# Patient Record
Sex: Female | Born: 1995 | Race: Black or African American | Hispanic: No | Smoking: Never smoker
Health system: Southern US, Community
[De-identification: ages and names within clinical notes are randomized; demographics above are authoritative.]

---
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
## 2016-01-03 ENCOUNTER — Encounter (HOSPITAL_COMMUNITY): Payer: Self-pay | Admitting: Emergency Medicine

## 2016-01-03 DIAGNOSIS — R112 Nausea with vomiting, unspecified: Secondary | ICD-10-CM | POA: Insufficient documentation

## 2016-01-03 DIAGNOSIS — R197 Diarrhea, unspecified: Secondary | ICD-10-CM | POA: Insufficient documentation

## 2016-01-03 DIAGNOSIS — Z3202 Encounter for pregnancy test, result negative: Secondary | ICD-10-CM | POA: Insufficient documentation

## 2016-01-03 LAB — CBC
HEMATOCRIT: 41.1 % (ref 36.0–46.0)
HEMOGLOBIN: 13.7 g/dL (ref 12.0–15.0)
MCH: 30.3 pg (ref 26.0–34.0)
MCHC: 33.3 g/dL (ref 30.0–36.0)
MCV: 90.9 fL (ref 78.0–100.0)
Platelets: 200 10*3/uL (ref 150–400)
RBC: 4.52 MIL/uL (ref 3.87–5.11)
RDW: 12 % (ref 11.5–15.5)
WBC: 13.2 10*3/uL — ABNORMAL HIGH (ref 4.0–10.5)

## 2016-01-03 LAB — COMPREHENSIVE METABOLIC PANEL
ALBUMIN: 4.4 g/dL (ref 3.5–5.0)
ALT: 12 U/L — ABNORMAL LOW (ref 14–54)
ANION GAP: 9 (ref 5–15)
AST: 19 U/L (ref 15–41)
Alkaline Phosphatase: 48 U/L (ref 38–126)
BUN: 7 mg/dL (ref 6–20)
CO2: 23 mmol/L (ref 22–32)
Calcium: 9.3 mg/dL (ref 8.9–10.3)
Chloride: 108 mmol/L (ref 101–111)
Creatinine, Ser: 0.73 mg/dL (ref 0.44–1.00)
GFR calc non Af Amer: >60 mL/min (ref >60)
GLUCOSE: 147 mg/dL — AB (ref 65–99)
POTASSIUM: 3.9 mmol/L (ref 3.5–5.1)
SODIUM: 140 mmol/L (ref 135–145)
Total Bilirubin: 0.7 mg/dL (ref 0.3–1.2)
Total Protein: 6.9 g/dL (ref 6.5–8.1)

## 2016-01-03 LAB — LIPASE, BLOOD: LIPASE: 19 U/L (ref 11–51)

## 2016-01-03 NOTE — ED Notes (Signed)
Pt. reports emesis , diarrhea and epigastric pain when vomittting  onset this evening , denies fever or chills.

## 2016-01-04 ENCOUNTER — Emergency Department (HOSPITAL_COMMUNITY)
Admission: EM | Admit: 2016-01-04 | Discharge: 2016-01-04 | Disposition: A | Discharge status: Home / Self Care | Payer: Self-pay | Attending: Emergency Medicine | Admitting: Emergency Medicine

## 2016-01-04 DIAGNOSIS — R112 Nausea with vomiting, unspecified: Secondary | ICD-10-CM

## 2016-01-04 DIAGNOSIS — R197 Diarrhea, unspecified: Secondary | ICD-10-CM

## 2016-01-04 LAB — URINE MICROSCOPIC-ADD ON: BACTERIA UA: NONE SEEN

## 2016-01-04 LAB — URINALYSIS, ROUTINE W REFLEX MICROSCOPIC
BILIRUBIN URINE: NEGATIVE
Glucose, UA: NEGATIVE mg/dL
KETONES UR: 15 mg/dL — AB
Leukocytes, UA: NEGATIVE
NITRITE: NEGATIVE
PH: 6.5 (ref 5.0–8.0)
Protein, ur: NEGATIVE mg/dL
Specific Gravity, Urine: 1.022 (ref 1.005–1.030)

## 2016-01-04 LAB — I-STAT BETA HCG BLOOD, ED (MC, WL, AP ONLY)

## 2016-01-04 MED ORDER — ONDANSETRON HCL 4 MG/2ML IJ SOLN
4.0000 mg | Freq: Once | INTRAMUSCULAR | Status: AC
  Administered 2016-01-04: 4 mg via INTRAVENOUS
  Filled 2016-01-04: qty 2

## 2016-01-04 MED ORDER — OXYCODONE-ACETAMINOPHEN 5-325 MG PO TABS
ORAL_TABLET | ORAL | Status: AC
  Administered 2016-01-04: 01:00:00
  Filled 2016-01-04: qty 1

## 2016-01-04 MED ORDER — ONDANSETRON HCL 4 MG PO TABS: 4.0000 mg | ORAL_TABLET | Freq: Three times a day (TID) | ORAL | Status: DC | PRN

## 2016-01-04 MED ORDER — SODIUM CHLORIDE 0.9 % IV BOLUS (SEPSIS)
1000.0000 mL | Freq: Once | INTRAVENOUS | Status: AC
  Administered 2016-01-04: 1000 mL via INTRAVENOUS

## 2016-01-04 NOTE — ED Notes (Signed)
Dr. Rees at bedside  

## 2016-01-04 NOTE — Discharge Instructions (Signed)

## 2016-01-04 NOTE — ED Provider Notes (Signed)
CSN: [id number]     Arrival date & time 01/03/16  2221 History  By signing my name below, I, [name], attest that this documentation has been prepared under the direction and in the presence of Tilden FossaElizabeth Shaunae Sieloff, MD Electronically Signed: Charlean Merlohini Rajnarayanan, ED Scribe 01/04/2016 at 7:51 AM.   Chief Complaint  Patient presents with  . Emesis  . Diarrhea  . Abdominal Pain   The history is provided by the patient. No language interpreter was used.    HPI Comments: Gretta BeganDaijah J Burnett is a 20 y.o. female who presents to the Emergency Department complaining of diarrhea and emesis which began when she woke up from her nap this evening. Pt denies any abdominal pain, fever, chills, or dysuria. 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 Symptoms are moderate and constant nature.  History reviewed. No pertinent past medical history. History reviewed. No pertinent past surgical history. No family history on file. Social History  Substance Use Topics  . Smoking status: Never Smoker   . Smokeless tobacco: None  . Alcohol Use: Yes   OB History    No data available     Review of Systems  Constitutional: Negative for fever and chills.  Gastrointestinal: Positive for vomiting and diarrhea. Negative for abdominal pain.  Genitourinary: Negative for dysuria.  All other systems reviewed and are negative.  Allergies  Review of patient's allergies indicates no known allergies.  Home Medications   Prior to Admission medications   Medication Sig Start Date End Date Taking? Authorizing Provider  ibuprofen (ADVIL,MOTRIN) 200 MG tablet Take 200 mg by mouth every 6 (six) hours as needed for moderate pain.   Yes Historical Provider, MD  ondansetron (ZOFRAN) 4 MG tablet Take 1 tablet (4 mg total) by mouth every 8 (eight) hours as needed for nausea or vomiting. 01/04/16   Tilden FossaElizabeth Elisabet Gutzmer, MD   BP 136/82 mmHg  Pulse 97  Temp(Src)  97.8 F (36.6 C) (Oral)  Resp 18  Ht 5\' 6"  (1.676 m)  Wt 142 lb (64.411 kg)  BMI 22.93 kg/m2  SpO2 98%  LMP 12/12/2015 Physical Exam  Constitutional: She is oriented to person, place, and time. She appears well-developed and well-nourished.  HENT:  Head: Normocephalic and atraumatic.  Cardiovascular: Normal rate and regular rhythm.   No murmur heard. Pulmonary/Chest: Effort normal and breath sounds normal. No respiratory distress.  Abdominal: Soft. There is no tenderness. There is no rebound and no guarding.  Musculoskeletal: She exhibits no edema or tenderness.  Neurological: She is alert and oriented to person, place, and time.  Skin: Skin is warm and dry.  Psychiatric: She has a normal mood and affect. Her behavior is normal.  Nursing note and vitals reviewed.   ED Course  Procedures  DIAGNOSTIC STUDIES: Oxygen Saturation is 98% on RA, normal by my interpretation.    COORDINATION OF CARE: 1:47 AM-Discussed treatment plan which includes UA, urine pregnancy test, ed medications, and blood work, with pt at bedside and pt agreed to plan.   Labs Review Labs Reviewed  COMPREHENSIVE METABOLIC PANEL - Abnormal; Notable for the following:    Glucose, Bld 147 (*)    ALT 12 (*)    All other components within normal limits  CBC - Abnormal; Notable for the following:    WBC 13.2 (*)    All other components within normal limits  URINALYSIS, ROUTINE W REFLEX MICROSCOPIC (NOT AT [facility]) - Abnormal; Notable  for the following:    Hgb urine dipstick TRACE (*)    Ketones, ur 15 (*)    All other components within normal limits  URINE MICROSCOPIC-ADD ON - Abnormal; Notable for the following:    Squamous Epithelial / LPF 0-5 (*)    All other components within normal limits  LIPASE, BLOOD  POC URINE PREG, ED  I-STAT BETA HCG BLOOD, ED ([facility], [facility], [facility] ONLY)    Imaging Review No results found. I have personally reviewed and evaluated these images and lab results as part of my medical  decision-making.   EKG Interpretation None      MDM   Final diagnoses:  Nausea vomiting and diarrhea   Patient here for evaluation of vomiting and diarrhea. Abdominal examination is benign on initial examination and repeat evaluation. She is tolerating oral fluids without difficulty. Discussed with patient homecare for vomiting and diarrhea, likely gastroenteritis. Discussed close return precautions if she develops any abdominal pain or new/worrisome symptoms.  I personally performed the services described in this documentation, which was scribed in my presence. The recorded information has been reviewed and is accurate.    Tilden Fossa, MD 01/04/16 [phone]

## 2016-11-26 ENCOUNTER — Encounter (HOSPITAL_COMMUNITY): Payer: Self-pay | Admitting: *Deleted

## 2016-11-26 ENCOUNTER — Emergency Department (HOSPITAL_COMMUNITY)
Admission: EM | Admit: 2016-11-26 | Discharge: 2016-11-26 | Disposition: A | Discharge status: Home / Self Care | Payer: 59 | Attending: Emergency Medicine | Admitting: Emergency Medicine

## 2016-11-26 DIAGNOSIS — R05 Cough: Secondary | ICD-10-CM | POA: Diagnosis present

## 2016-11-26 DIAGNOSIS — J111 Influenza due to unidentified influenza virus with other respiratory manifestations: Secondary | ICD-10-CM | POA: Diagnosis not present

## 2016-11-26 MED ORDER — OSELTAMIVIR PHOSPHATE 75 MG PO CAPS: 75.0000 mg | ORAL_CAPSULE | Freq: Two times a day (BID) | ORAL | 0 refills | Status: DC

## 2016-11-26 MED ORDER — HYDROCOD POLST-CPM POLST ER 10-8 MG/5ML PO SUER: 5.0000 mL | Freq: Every evening | ORAL | 0 refills | Status: DC | PRN

## 2016-11-26 MED ORDER — BENZONATATE 100 MG PO CAPS: 100.0000 mg | ORAL_CAPSULE | Freq: Three times a day (TID) | ORAL | 0 refills | Status: DC

## 2016-11-26 NOTE — ED Provider Notes (Signed)
[facility]-EMERGENCY DEPT Provider Note   CSN: [id number] Arrival date & time: 11/26/16  1511  By signing my name below, I, Talbert NanPaul Grant, attest that this documentation has been prepared under the direction and in the presence of Kerrie BuffaloHope Adoria Kawamoto, NP. Electronically Signed: Talbert NanPaul Grant, Scribe. 11/26/16. 5:40 PM  .   History   Chief Complaint Chief Complaint  Patient presents with  . Influenza    HPI Phyllis Burnett is a 21 y.o. female who presents to the Emergency Department complaining of gradual onset, moderate, persistent generalized body aches that started yesterday. Pt has associated headache, fatigue, dry cough, sore throat, subjective fever, chills, sweats, congestion, diarrhea. Pt denies nausea, vomiting.    The history is provided by the patient. No language interpreter was used.    History reviewed. No pertinent past medical history.  There are no active problems to display for this patient.   History reviewed. No pertinent surgical history.  OB History    No data available       Home Medications    Prior to Admission medications   Medication Sig Start Date End Date Taking? Authorizing Provider  benzonatate (TESSALON) 100 MG capsule Take 1 capsule (100 mg total) by mouth every 8 (eight) hours. 11/26/16   Marton Malizia Orlene OchM Dandra Shambaugh, NP  chlorpheniramine-HYDROcodone (TUSSIONEX PENNKINETIC ER) 10-8 MG/5ML SUER Take 5 mLs by mouth at bedtime as needed for cough. 11/26/16   Benjiman Sedgwick Orlene OchM Kimaya Whitlatch, NP  ibuprofen (ADVIL,MOTRIN) 200 MG tablet Take 200 mg by mouth every 6 (six) hours as needed for moderate pain.    Historical Provider, MD  ondansetron (ZOFRAN) 4 MG tablet Take 1 tablet (4 mg total) by mouth every 8 (eight) hours as needed for nausea or vomiting. 01/04/16   Tilden FossaElizabeth Rees, MD  oseltamivir (TAMIFLU) 75 MG capsule Take 1 capsule (75 mg total) by mouth every 12 (twelve) hours. 11/26/16   Isair Inabinet Orlene OchM Aseel Uhde, NP    Family History History reviewed. No pertinent family history.  Social  History Social History  Substance Use Topics  . Smoking status: Never Smoker  . Smokeless tobacco: Not on file  . Alcohol use Yes     Allergies   Patient has no known allergies.   Review of Systems Review of Systems  Constitutional: Positive for fatigue.  HENT: Positive for congestion, sinus pain and sore throat.   Respiratory: Chest tightness: with cough.   Gastrointestinal: Positive for diarrhea. Negative for nausea and vomiting.  Genitourinary: Negative for decreased urine volume.  Musculoskeletal: Positive for arthralgias and myalgias.  Skin: Negative for rash.  Neurological: Positive for headaches.  Psychiatric/Behavioral: Negative for confusion.     Physical Exam Updated Vital Signs BP 100/66 (BP Location: Right Arm)   Pulse 78   Temp 99.6 F (37.6 C) (Oral)   Resp 18   LMP 11/26/2016   SpO2 100%   Physical Exam  Constitutional: She is oriented to person, place, and time. She appears well-developed and well-nourished. No distress.  HENT:  Head: Normocephalic and atraumatic.  Right Ear: Tympanic membrane normal.  Left Ear: Tympanic membrane normal.  Mouth/Throat: Uvula is midline, oropharynx is clear and moist and mucous membranes are normal.  Eyes: EOM are normal. Pupils are equal, round, and reactive to light.  Neck: Neck supple.  Cardiovascular: Normal rate and regular rhythm.   Pulmonary/Chest: Effort normal. She has no wheezes. She has no rales.  Abdominal: Soft. Bowel sounds are normal. There is no tenderness.  Musculoskeletal: Normal range of motion.  Neurological: She  is alert and oriented to person, place, and time.  Skin: Skin is warm and dry.  Psychiatric: She has a normal mood and affect. Her behavior is normal.  Nursing note and vitals reviewed.    ED Treatments / Results   DIAGNOSTIC STUDIES: Oxygen Saturation is 100% on room air, normal by my interpretation.    COORDINATION OF CARE: 5:40 PM Discussed treatment plan with pt at bedside  and pt agreed to plan.  Labs (all labs ordered are listed, but only abnormal results are displayed) Labs Reviewed - No data to display  Radiology No results found.  Procedures Procedures (including critical care time)  Medications Ordered in ED   Initial Impression / Assessment and Plan / ED Course  I have reviewed the triage vital signs and the nursing notes. Patient with symptoms consistent with influenza.  Vitals are stable, low-grade fever.  No signs of dehydration, tolerating PO's.  Lungs are clear. Due to patient's presentation and physical exam a chest x-ray was not ordered bc likely diagnosis of flu. Patient will be discharged with instructions to orally hydrate, rest, and use over-the-counter medications such as anti-inflammatories ibuprofen and Aleve for muscle aches and Tylenol for fever.  Patient will also be given a cough suppressant and Tamiflu. Return precautions.   Final Clinical Impressions(s) / ED Diagnoses   Final diagnoses:  Influenza    New Prescriptions Discharge Medication List as of 11/26/2016  5:43 PM    START taking these medications   Details  benzonatate (TESSALON) 100 MG capsule Take 1 capsule (100 mg total) by mouth every 8 (eight) hours., Starting Tue 11/26/2016, Print    chlorpheniramine-HYDROcodone (TUSSIONEX PENNKINETIC ER) 10-8 MG/5ML SUER Take 5 mLs by mouth at bedtime as needed for cough., Starting Tue 11/26/2016, Print    oseltamivir (TAMIFLU) 75 MG capsule Take 1 capsule (75 mg total) by mouth every 12 (twelve) hours., Starting Tue 11/26/2016, Print       I personally performed the services described in this documentation, which was scribed in my presence. The recorded information has been reviewed and is accurate.     [address] [place], [place] 11/28/16 [id number]    Alvira Monday, MD 11/28/16 [phone]

## 2016-11-26 NOTE — ED Triage Notes (Signed)
Pt reports having the flu. States she has bodyaches, headache, sore throat, fever since yesterday. Also having n/v/d. Mask on pt at triage.

## 2016-12-02 ENCOUNTER — Emergency Department (HOSPITAL_COMMUNITY)
Admission: EM | Admit: 2016-12-02 | Discharge: 2016-12-03 | Disposition: A | Discharge status: Home / Self Care | Payer: 59 | Attending: Emergency Medicine | Admitting: Emergency Medicine

## 2016-12-02 ENCOUNTER — Encounter (HOSPITAL_COMMUNITY): Payer: Self-pay | Admitting: *Deleted

## 2016-12-02 DIAGNOSIS — N939 Abnormal uterine and vaginal bleeding, unspecified: Secondary | ICD-10-CM | POA: Diagnosis present

## 2016-12-02 DIAGNOSIS — Z7982 Long term (current) use of aspirin: Secondary | ICD-10-CM | POA: Diagnosis not present

## 2016-12-02 DIAGNOSIS — N946 Dysmenorrhea, unspecified: Secondary | ICD-10-CM | POA: Insufficient documentation

## 2016-12-02 LAB — POC URINE PREG, ED: PREG TEST UR: NEGATIVE

## 2016-12-02 NOTE — ED Triage Notes (Signed)
Pt started her period a week ago, reports increased vaginal bleeding since being diagnosed with the flu. Has had mild abdominal cramping.

## 2016-12-02 NOTE — ED Provider Notes (Signed)
[facility] Provider Note   CSN: [id number] Arrival date & time: 12/02/16  2150  By signing my name below, I, Modena JanskyAlbert Thayil, attest that this documentation has been prepared under the direction and in the presence of non-physician practitioner, Kerrie BuffaloHope Neese, NP. Electronically Signed: Modena JanskyAlbert Thayil, Scribe. 12/02/2016. 11:43 PM.  History   Chief Complaint Chief Complaint  Patient presents with  . Vaginal Bleeding   The history is provided by the patient.  Vaginal Bleeding  Primary symptoms include vaginal bleeding. There has been no fever. This is a new problem. The current episode started 6 to 12 hours ago. The problem occurs constantly. The problem has not changed since onset.Associated symptoms include abdominal pain. She has tried acetaminophen for the symptoms. The treatment provided no relief. She uses nothing for contraception.   HPI Comments: Phyllis Burnett is a 21 y.o. female who presents to the Emergency Department complaining of vaginal bleeding that started today. She was seen in the ED for influenza on 11/26/16. She states she is currently on her menstrual cycle, which initially came on lightly then became heavy. She has hx of severe cycles that are usually treated with ibuprofen. No modifying factors for current episode. She reports associated abdominal cramping. She denies any contraceptive use or other complaints. She does report having been started on OC's about 4 years ago to try and help with her periods but she only took for a few months. She has not returned to her GYN since then.   History reviewed. No pertinent past medical history.  There are no active problems to display for this patient.   History reviewed. No pertinent surgical history.  OB History    No data available       Home Medications    Prior to Admission medications   Medication Sig Start Date End Date Taking? Authorizing Provider  aspirin-acetaminophen-caffeine (EXCEDRIN MIGRAINE)  [phone] MG tablet Take 1 tablet by mouth every 6 (six) hours as needed for headache.   Yes Historical Provider, MD  benzonatate (TESSALON) 100 MG capsule Take 1 capsule (100 mg total) by mouth every 8 (eight) hours. Patient taking differently: Take 100 mg by mouth every 8 (eight) hours as needed for cough.  11/26/16  Yes Hope Orlene OchM Neese, NP  chlorpheniramine-HYDROcodone (TUSSIONEX PENNKINETIC ER) 10-8 MG/5ML SUER Take 5 mLs by mouth at bedtime as needed for cough. 11/26/16  Yes Hope Orlene OchM Neese, NP  oseltamivir (TAMIFLU) 75 MG capsule Take 1 capsule (75 mg total) by mouth every 12 (twelve) hours. 11/26/16  Yes Hope Orlene OchM Neese, NP  naproxen (NAPROSYN) 375 MG tablet Take 1 tablet (375 mg total) by mouth 2 (two) times daily. 12/03/16   Hope Orlene OchM Neese, NP    Family History No family history on file.  Social History Social History  Substance Use Topics  . Smoking status: Never Smoker  . Smokeless tobacco: Never Used  . Alcohol use Yes     Allergies   Patient has no known allergies.   Review of Systems Review of Systems  Gastrointestinal: Positive for abdominal pain.  Genitourinary: Positive for vaginal bleeding.  All other systems reviewed and are negative.    Physical Exam Updated Vital Signs BP 125/83 (BP Location: Right Arm)   Pulse 68   Temp 98 F (36.7 C)   Resp 21   Ht 5\' 6"  (1.676 m)   Wt 140 lb (63.5 kg)   LMP 11/25/2016   SpO2 100%   BMI 22.60 kg/m   Physical Exam  Constitutional: She appears well-developed and well-nourished. No distress.  HENT:  Head: Normocephalic.  Eyes: Conjunctivae are normal.  Neck: Neck supple.  Cardiovascular: Normal rate and regular rhythm.   Pulmonary/Chest: Effort normal.  Abdominal: Soft. There is no tenderness.  Genitourinary:  Genitourinary Comments: Pelvic Exam: external genitalia without lesions, small blood vaginal vault, no cervical motion tenderness, uterus without enlargement.   Musculoskeletal: Normal range of motion.    Neurological: She is alert.  Skin: Skin is warm and dry.  Psychiatric: She has a normal mood and affect. Her behavior is normal.  Nursing note and vitals reviewed.    ED Treatments / Results  DIAGNOSTIC STUDIES: Oxygen Saturation is 100% on RA, normal by my interpretation.    COORDINATION OF CARE: 11:47 PM- Pt advised of plan for treatment and pt agrees.  Labs (all labs ordered are listed, but only abnormal results are displayed) Labs Reviewed  WET PREP, GENITAL - Abnormal; Notable for the following:       Result Value   Clue Cells Wet Prep HPF POC PRESENT (*)    WBC, Wet Prep HPF POC MANY (*)    All other components within normal limits  URINALYSIS, ROUTINE W REFLEX MICROSCOPIC - Abnormal; Notable for the following:    Hgb urine dipstick MODERATE (*)    Squamous Epithelial / LPF 0-5 (*)    All other components within normal limits  CBC WITH DIFFERENTIAL/PLATELET  BASIC METABOLIC PANEL  POC URINE PREG, ED  I-STAT BETA HCG BLOOD, ED ([facility], [facility], [facility] ONLY)  GC/CHLAMYDIA PROBE AMP ([place]) NOT AT [facility]   Radiology No results found.  Procedures Procedures (including critical care time)  Medications Ordered in ED Medications - No data to display   Initial Impression / Assessment and Plan / ED Course  I have reviewed the triage vital signs and the nursing notes.  Pertinent labs results that were available during my care of the patient were reviewed by me and considered in my medical decision making (see chart for details).   Final Clinical Impressions(s) / ED Diagnoses  21 y.o. female with vaginal bleeding and cramping stable for d/c without anemia and with only minimal bleeding at this time. She does not appear ill. Discussed dysmenorrhea and OC's to try and decreased the bleeding and cramping. Patient states that she will make an appointment with her GYN to discuss the options.   Final diagnoses:  Dysmenorrhea    New Prescriptions Discharge Medication List as of  12/03/2016  1:03 AM    START taking these medications   Details  naproxen (NAPROSYN) 375 MG tablet Take 1 tablet (375 mg total) by mouth 2 (two) times daily., Starting Tue 12/03/2016, Print      I personally performed the services described in this documentation, which was scribed in my presence. The recorded information has been reviewed and is accurate.     [address] [place], [place] 12/04/16 1610    Glynn Octave, MD 12/04/16 [phone]

## 2016-12-03 LAB — BASIC METABOLIC PANEL
Anion gap: 7 (ref 5–15)
BUN: 8 mg/dL (ref 6–20)
CHLORIDE: 105 mmol/L (ref 101–111)
CO2: 26 mmol/L (ref 22–32)
Calcium: 9.5 mg/dL (ref 8.9–10.3)
Creatinine, Ser: 0.7 mg/dL (ref 0.44–1.00)
GFR calc Af Amer: >60 mL/min (ref >60)
GFR calc non Af Amer: >60 mL/min (ref >60)
Glucose, Bld: 84 mg/dL (ref 65–99)
POTASSIUM: 3.8 mmol/L (ref 3.5–5.1)
SODIUM: 138 mmol/L (ref 135–145)

## 2016-12-03 LAB — CBC WITH DIFFERENTIAL/PLATELET
Basophils Absolute: 0 10*3/uL (ref 0.0–0.1)
Basophils Relative: 0 %
EOS ABS: 0.1 10*3/uL (ref 0.0–0.7)
EOS PCT: 1 %
HCT: 39.7 % (ref 36.0–46.0)
HEMOGLOBIN: 13.3 g/dL (ref 12.0–15.0)
LYMPHS ABS: 3 10*3/uL (ref 0.7–4.0)
LYMPHS PCT: 31 %
MCH: 30.2 pg (ref 26.0–34.0)
MCHC: 33.5 g/dL (ref 30.0–36.0)
MCV: 90.2 fL (ref 78.0–100.0)
Monocytes Absolute: 0.5 10*3/uL (ref 0.1–1.0)
Monocytes Relative: 5 %
Neutro Abs: 6.2 10*3/uL (ref 1.7–7.7)
Neutrophils Relative %: 63 %
PLATELETS: 233 10*3/uL (ref 150–400)
RBC: 4.4 MIL/uL (ref 3.87–5.11)
RDW: 12 % (ref 11.5–15.5)
WBC: 9.8 10*3/uL (ref 4.0–10.5)

## 2016-12-03 LAB — URINALYSIS, ROUTINE W REFLEX MICROSCOPIC
BACTERIA UA: NONE SEEN
Bilirubin Urine: NEGATIVE
Glucose, UA: NEGATIVE mg/dL
Ketones, ur: NEGATIVE mg/dL
Leukocytes, UA: NEGATIVE
Nitrite: NEGATIVE
PH: 6 (ref 5.0–8.0)
Protein, ur: NEGATIVE mg/dL
SPECIFIC GRAVITY, URINE: 1.019 (ref 1.005–1.030)

## 2016-12-03 LAB — WET PREP, GENITAL
SPERM: NONE SEEN
TRICH WET PREP: NONE SEEN
Yeast Wet Prep HPF POC: NONE SEEN

## 2016-12-03 LAB — I-STAT BETA HCG BLOOD, ED (MC, WL, AP ONLY)

## 2016-12-03 LAB — GC/CHLAMYDIA PROBE AMP (~~LOC~~) NOT AT ARMC
Chlamydia: NEGATIVE
Neisseria Gonorrhea: NEGATIVE

## 2016-12-03 MED ORDER — NAPROXEN 375 MG PO TABS: 375.0000 mg | ORAL_TABLET | Freq: Two times a day (BID) | ORAL | 0 refills | Status: DC

## 2016-12-03 NOTE — ED Notes (Signed)
Pt departed in NAD.  

## 2016-12-03 NOTE — Discharge Instructions (Signed)
Follow up with your GYN to discuss your heavy and painful periods.

## 2017-10-08 DIAGNOSIS — Z803 Family history of malignant neoplasm of breast: Secondary | ICD-10-CM | POA: Insufficient documentation

## 2017-12-08 ENCOUNTER — Emergency Department (HOSPITAL_COMMUNITY)
Admission: EM | Admit: 2017-12-08 | Discharge: 2017-12-08 | Disposition: A | Discharge status: Home / Self Care | Payer: Managed Care, Other (non HMO) | Attending: Emergency Medicine | Admitting: Emergency Medicine

## 2017-12-08 ENCOUNTER — Other Ambulatory Visit: Payer: Self-pay

## 2017-12-08 ENCOUNTER — Encounter (HOSPITAL_COMMUNITY): Payer: Self-pay | Admitting: *Deleted

## 2017-12-08 DIAGNOSIS — R109 Unspecified abdominal pain: Secondary | ICD-10-CM | POA: Diagnosis not present

## 2017-12-08 DIAGNOSIS — R112 Nausea with vomiting, unspecified: Secondary | ICD-10-CM

## 2017-12-08 DIAGNOSIS — Z79899 Other long term (current) drug therapy: Secondary | ICD-10-CM | POA: Diagnosis not present

## 2017-12-08 LAB — CBC
HEMATOCRIT: 43.3 % (ref 36.0–46.0)
Hemoglobin: 14.4 g/dL (ref 12.0–15.0)
MCH: 30.6 pg (ref 26.0–34.0)
MCHC: 33.3 g/dL (ref 30.0–36.0)
MCV: 92.1 fL (ref 78.0–100.0)
Platelets: 198 10*3/uL (ref 150–400)
RBC: 4.7 MIL/uL (ref 3.87–5.11)
RDW: 13 % (ref 11.5–15.5)
WBC: 9.5 10*3/uL (ref 4.0–10.5)

## 2017-12-08 LAB — COMPREHENSIVE METABOLIC PANEL
ALT: 13 U/L — ABNORMAL LOW (ref 14–54)
AST: 19 U/L (ref 15–41)
Albumin: 4.2 g/dL (ref 3.5–5.0)
Alkaline Phosphatase: 50 U/L (ref 38–126)
Anion gap: 9 (ref 5–15)
BUN: 10 mg/dL (ref 6–20)
CHLORIDE: 105 mmol/L (ref 101–111)
CO2: 23 mmol/L (ref 22–32)
Calcium: 8.9 mg/dL (ref 8.9–10.3)
Creatinine, Ser: 0.81 mg/dL (ref 0.44–1.00)
GFR calc Af Amer: >60 mL/min (ref >60)
Glucose, Bld: 135 mg/dL — ABNORMAL HIGH (ref 65–99)
POTASSIUM: 4.2 mmol/L (ref 3.5–5.1)
SODIUM: 137 mmol/L (ref 135–145)
Total Bilirubin: 0.9 mg/dL (ref 0.3–1.2)
Total Protein: 6.9 g/dL (ref 6.5–8.1)

## 2017-12-08 LAB — URINALYSIS, ROUTINE W REFLEX MICROSCOPIC
Bilirubin Urine: NEGATIVE
GLUCOSE, UA: NEGATIVE mg/dL
Hgb urine dipstick: NEGATIVE
Ketones, ur: 5 mg/dL — AB
LEUKOCYTES UA: NEGATIVE
Nitrite: NEGATIVE
PH: 5 (ref 5.0–8.0)
Protein, ur: NEGATIVE mg/dL
Specific Gravity, Urine: 1.028 (ref 1.005–1.030)

## 2017-12-08 LAB — I-STAT BETA HCG BLOOD, ED (MC, WL, AP ONLY): I-stat hCG, quantitative: <5 m[IU]/mL (ref <5)

## 2017-12-08 LAB — LIPASE, BLOOD: LIPASE: 21 U/L (ref 11–51)

## 2017-12-08 MED ORDER — ONDANSETRON 4 MG PO TBDP
4.0000 mg | ORAL_TABLET | Freq: Once | ORAL | Status: AC
  Administered 2017-12-08: 4 mg via ORAL
  Filled 2017-12-08: qty 1

## 2017-12-08 MED ORDER — ONDANSETRON 4 MG PO TBDP: 4.0000 mg | ORAL_TABLET | Freq: Three times a day (TID) | ORAL | 0 refills | Status: DC | PRN

## 2017-12-08 NOTE — ED Notes (Signed)
Pt given ginger ale for PO challenge. Will reassess. 

## 2017-12-08 NOTE — ED Notes (Signed)
ED Provider at bedside. 

## 2017-12-08 NOTE — Discharge Instructions (Signed)
Push fluids in small amounts and advance diet as tolerated. Take Zofran for nausea as needed.

## 2017-12-08 NOTE — ED Provider Notes (Signed)
[facility] EMERGENCY DEPARTMENT Provider Note   CSN: [id number] Arrival date & time: 12/08/17  0137     History   Chief Complaint Chief Complaint  Patient presents with  . Emesis    HPI Phyllis Burnett is a 22 y.o. female.  Patient presents to the emergency department for evaluation of vomiting that started around 8:00 pm last night. She has had approximately 12 episodes of nonbloody emesis. She reports her last bowel movement with loose. No fever. She has upper abdominal discomfort that started after multiple episodes of vomiting. No cough, SOB, body aches or congestion.    The history is provided by the patient. No language interpreter was used.  Emesis   Associated symptoms include abdominal pain (See HPI.). Pertinent negatives include no chills, no cough, no fever and no myalgias.    History reviewed. No pertinent past medical history.  There are no active problems to display for this patient.   History reviewed. No pertinent surgical history.  OB History    No data available       Home Medications    Prior to Admission medications   Medication Sig Start Date End Date Taking? Authorizing Provider  aspirin-acetaminophen-caffeine (EXCEDRIN MIGRAINE) [phone] MG tablet Take 1 tablet by mouth every 6 (six) hours as needed for headache.    [provider]  benzonatate (TESSALON) 100 MG capsule Take 1 capsule (100 mg total) by mouth every 8 (eight) hours. Patient taking differently: Take 100 mg by mouth every 8 (eight) hours as needed for cough.  11/26/16   Janne Napoleon, NP  chlorpheniramine-HYDROcodone [facility]) 10-8 MG/5ML SUER Take 5 mLs by mouth at bedtime as needed for cough. 11/26/16   Janne Napoleon, NP  naproxen (NAPROSYN) 375 MG tablet Take 1 tablet (375 mg total) by mouth 2 (two) times daily. 12/03/16   Janne Napoleon, NP  oseltamivir (TAMIFLU) 75 MG capsule Take 1 capsule (75 mg total) by mouth every 12 (twelve)  hours. 11/26/16   Janne Napoleon, NP    Family History No family history on file.  Social History Social History   Tobacco Use  . Smoking status: Never Smoker  . Smokeless tobacco: Never Used  Substance Use Topics  . Alcohol use: Yes  . Drug use: No     Allergies   Patient has no known allergies.   Review of Systems Review of Systems  Constitutional: Negative for chills and fever.  HENT: Negative.  Negative for congestion.   Respiratory: Negative.  Negative for cough and shortness of breath.   Cardiovascular: Negative.   Gastrointestinal: Positive for abdominal pain (See HPI.), nausea and vomiting.  Genitourinary: Negative.  Negative for decreased urine volume, dysuria, vaginal bleeding and vaginal discharge.  Musculoskeletal: Negative.  Negative for myalgias.  Skin: Negative.   Neurological: Negative.  Negative for weakness and light-headedness.     Physical Exam Updated Vital Signs BP 118/65   Pulse 74   Temp 98.6 F (37 C) (Oral)   Resp (!) 21   Ht 5\' 7"  (1.702 m)   Wt 72.6 kg (160 lb)   LMP 10/16/2017   SpO2 100%   BMI 25.06 kg/m   Physical Exam  Constitutional: She is oriented to person, place, and time. She appears well-developed and well-nourished.  HENT:  Head: Normocephalic.  Mouth/Throat: Oropharynx is clear and moist.  Neck: Normal range of motion. Neck supple.  Cardiovascular: Normal rate and regular rhythm.  No murmur heard. Pulmonary/Chest:  Effort normal and breath sounds normal. She has no wheezes. She has no rales.  Abdominal: Soft. Bowel sounds are normal. There is no tenderness. There is no rebound and no guarding.  Musculoskeletal: Normal range of motion.  Neurological: She is alert and oriented to person, place, and time.  Skin: Skin is warm and dry. No rash noted.  Psychiatric: She has a normal mood and affect.     ED Treatments / Results  Labs (all labs ordered are listed, but only abnormal results are displayed) Labs Reviewed    COMPREHENSIVE METABOLIC PANEL - Abnormal; Notable for the following components:      Result Value   Glucose, Bld 135 (*)    ALT 13 (*)    All other components within normal limits  URINALYSIS, ROUTINE W REFLEX MICROSCOPIC - Abnormal; Notable for the following components:   Ketones, ur 5 (*)    All other components within normal limits  LIPASE, BLOOD  CBC  I-STAT BETA HCG BLOOD, ED ([facility], [facility], [facility] ONLY)  POC URINE PREG, ED   Results for orders placed or performed during the hospital encounter of 12/08/17  Lipase, blood  Result Value Ref Range   Lipase 21 11 - 51 U/L  Comprehensive metabolic panel  Result Value Ref Range   Sodium 137 135 - 145 mmol/L   Potassium 4.2 3.5 - 5.1 mmol/L   Chloride 105 101 - 111 mmol/L   CO2 23 22 - 32 mmol/L   Glucose, Bld 135 (H) 65 - 99 mg/dL   BUN 10 6 - 20 mg/dL   Creatinine, Ser 1.61 0.44 - 1.00 mg/dL   Calcium 8.9 8.9 - 09.6 mg/dL   Total Protein 6.9 6.5 - 8.1 g/dL   Albumin 4.2 3.5 - 5.0 g/dL   AST 19 15 - 41 U/L   ALT 13 (L) 14 - 54 U/L   Alkaline Phosphatase 50 38 - 126 U/L   Total Bilirubin 0.9 0.3 - 1.2 mg/dL   GFR calc non Af Amer >60 >60 mL/min   GFR calc Af Amer >60 >60 mL/min   Anion gap 9 5 - 15  CBC  Result Value Ref Range   WBC 9.5 4.0 - 10.5 K/uL   RBC 4.70 3.87 - 5.11 MIL/uL   Hemoglobin 14.4 12.0 - 15.0 g/dL   HCT 04.5 40.9 - 81.1 %   MCV 92.1 78.0 - 100.0 fL   MCH 30.6 26.0 - 34.0 pg   MCHC 33.3 30.0 - 36.0 g/dL   RDW 91.4 78.2 - 95.6 %   Platelets 198 150 - 400 K/uL  Urinalysis, Routine w reflex microscopic  Result Value Ref Range   Color, Urine YELLOW YELLOW   APPearance CLEAR CLEAR   Specific Gravity, Urine 1.028 1.005 - 1.030   pH 5.0 5.0 - 8.0   Glucose, UA NEGATIVE NEGATIVE mg/dL   Hgb urine dipstick NEGATIVE NEGATIVE   Bilirubin Urine NEGATIVE NEGATIVE   Ketones, ur 5 (A) NEGATIVE mg/dL   Protein, ur NEGATIVE NEGATIVE mg/dL   Nitrite NEGATIVE NEGATIVE   Leukocytes, UA NEGATIVE NEGATIVE  I-Stat beta hCG  blood, ED  Result Value Ref Range   I-stat hCG, quantitative <5.0 <5 mIU/mL   Comment 3            EKG  EKG Interpretation None       Radiology No results found.  Procedures Procedures (including critical care time)  Medications Ordered in ED Medications  ondansetron (ZOFRAN-ODT) disintegrating tablet 4 mg (4  mg Oral Given 12/08/17 0306)     Initial Impression / Assessment and Plan / ED Course  I have reviewed the triage vital signs and the nursing notes.  Pertinent labs & imaging results that were available during my care of the patient were reviewed by me and considered in my medical decision making (see chart for details).     Patient here for nausea and vomiting that started last night. No fever, urinary symptoms, URI symptoms. She is not pregnant.   No vomiting while in the room. Zofran provided and patient subsequently passed a PO challenge and feels better. She can be discharged home with Rx Zofran and return precautions.   Final Clinical Impressions(s) / ED Diagnoses   Final diagnoses:  None   1. Emesis  ED Discharge Orders    None       Elpidio AnisUpstill, Shawnna Pancake, PA-C 12/08/17 0550    Glynn Octaveancour, Stephen, MD 12/08/17 [phone]

## 2017-12-08 NOTE — ED Triage Notes (Signed)
The pt is c/o vomiting since 2000 tonight with generalized abd pain  lmp jan 10th

## 2019-04-05 ENCOUNTER — Ambulatory Visit: Payer: Self-pay

## 2019-04-05 NOTE — Telephone Encounter (Signed)
Patient called and says she was around someone who tested positive for covid, but had completed their 14 day quarantine. She says she started having symptoms about 3-4 days ago, diarrhea, fatigue, headache, she says mild symptoms. She says she doesn't have a cough, no difficulty breathing. She says she checks her temperature and no fever, but at times feels warm. She asked about testing. I advised she will need to establish with a PCP, then have a virtual visit and they will refer her for testing, then a nurse will call to schedule. I advised if she didn't want to go that route, she could contact the [facility], go to a [facility] or do an e-visit on the [facility] site, she verbalized understanding and says she may just go to the UC or call back tomorrow to schedule with a provider.  Reason for Disposition . [1] COVID-19 infection suspected by caller or triager AND [2] mild symptoms (cough, fever, or others) AND [1] no complications or SOB  Answer Assessment - Initial Assessment Questions 1. COVID-19 DIAGNOSIS: "Who made your Coronavirus (COVID-19) diagnosis?" "Was it confirmed by a positive lab test?" If not diagnosed by a HCP, ask "Are there lots of cases (community spread) where you live?" (See public health department website, if unsure)     No diagnosis 2. ONSET: "When did the COVID-19 symptoms start?"      3 days ago 3. WORST SYMPTOM: "What is your worst symptom?" (e.g., cough, fever, shortness of breath, muscle aches)     Fatigue is the worse; have a headache 4. COUGH: "Do you have a cough?" If so, ask: "How bad is the cough?"       No 5. FEVER: "Do you have a fever?" If so, ask: "What is your temperature, how was it measured, and when did it start?"     No 6. RESPIRATORY STATUS: "Describe your breathing?" (e.g., shortness of breath, wheezing, unable to speak)      No problems 7. BETTER-SAME-WORSE: "Are you getting better, staying the same or getting worse compared to yesterday?"   If getting worse, ask, "In what way?"     Worse with feeling tired 8. HIGH RISK DISEASE: "Do you have any chronic medical problems?" (e.g., asthma, heart or lung disease, weak immune system, etc.)     N/A 9. PREGNANCY: "Is there any chance you are pregnant?" "When was your last menstrual period?"    N/A 10. OTHER SYMPTOMS: "Do you have any other symptoms?"  (e.g., chills, fatigue, headache, loss of smell or taste, muscle pain, sore throat)      Diarrhea, headache, fatigue  Protocols used: CORONAVIRUS (COVID-19) DIAGNOSED OR SUSPECTED-A-AH

## 2019-04-06 ENCOUNTER — Other Ambulatory Visit: Payer: Self-pay

## 2019-04-06 ENCOUNTER — Other Ambulatory Visit: Payer: Self-pay | Admitting: General Practice

## 2019-04-06 ENCOUNTER — Ambulatory Visit (HOSPITAL_COMMUNITY)
Admission: EM | Admit: 2019-04-06 | Discharge: 2019-04-06 | Disposition: A | Discharge status: Home / Self Care | Payer: Managed Care, Other (non HMO)

## 2019-04-06 ENCOUNTER — Encounter (HOSPITAL_COMMUNITY): Payer: Self-pay | Admitting: Urgent Care

## 2019-04-06 ENCOUNTER — Telehealth: Payer: Self-pay | Admitting: *Deleted

## 2019-04-06 DIAGNOSIS — R5383 Other fatigue: Secondary | ICD-10-CM | POA: Diagnosis not present

## 2019-04-06 DIAGNOSIS — R195 Other fecal abnormalities: Secondary | ICD-10-CM

## 2019-04-06 DIAGNOSIS — R5381 Other malaise: Secondary | ICD-10-CM

## 2019-04-06 DIAGNOSIS — B349 Viral infection, unspecified: Secondary | ICD-10-CM

## 2019-04-06 DIAGNOSIS — R6889 Other general symptoms and signs: Secondary | ICD-10-CM

## 2019-04-06 DIAGNOSIS — G44209 Tension-type headache, unspecified, not intractable: Secondary | ICD-10-CM

## 2019-04-06 NOTE — ED Triage Notes (Signed)
Patient reports symptoms started on Saturday of feeling tired, mild headache, general, mild weakness, loose stool.    Reports contact with covid positive patient, post - quarantine

## 2019-04-06 NOTE — ED Provider Notes (Addendum)
MRN: [id number] DOB: 11-Jan-1996  Subjective:   Phyllis Burnett is a 23 y.o. female presenting for 3-day history of progressive mild malaise and fatigue.  Patient is also had mild intermittent dull headaches with 1-2 loose stools per day.  She reports that she is concerned about having coronavirus as she works with [employer], works with people on probation.  She has not tried any medications for relief.  Of note, she reports that she did come into contact with a person that tested positive for COVID but was status post a 2-week quarantine.  Patient does not have any chronic conditions or surgeries.  She is in general good health.  She has been trying to hydrate a lot better and is trying to practice a healthy diet.  No current facility-administered medications for this encounter.   Current Outpatient Medications:  Marland Kitchen  VITAMIN D PO, Take by mouth., Disp: , Rfl:  .  aspirin-acetaminophen-caffeine (EXCEDRIN MIGRAINE) 250-250-65 MG tablet, Take 1 tablet by mouth every 6 (six) hours as needed for headache., Disp: , Rfl:    No Known Allergies  History reviewed. No pertinent past medical history.   History reviewed. No pertinent surgical history.  Review of Systems  Constitutional: Positive for malaise/fatigue. Negative for fever.  HENT: Negative for congestion, ear pain, sinus pain and sore throat.   Eyes: Negative for blurred vision, double vision, discharge and redness.  Respiratory: Negative for cough, hemoptysis, shortness of breath and wheezing.   Cardiovascular: Negative for chest pain.  Gastrointestinal: Positive for diarrhea (loose stools). Negative for abdominal pain, nausea and vomiting.  Genitourinary: Negative for dysuria, flank pain and hematuria.  Musculoskeletal: Negative for myalgias.  Skin: Negative for rash.  Neurological: Positive for headaches. Negative for weakness.  Psychiatric/Behavioral: Negative for depression and substance abuse.    Objective:   Vitals: BP  132/74 (BP Location: Left Arm)   Pulse 77   Temp 98.2 F (36.8 C) (Temporal)   Resp 16   LMP 03/08/2019   SpO2 100%   Physical Exam Constitutional:      General: She is not in acute distress.    Appearance: Normal appearance. She is well-developed. She is not ill-appearing, toxic-appearing or diaphoretic.  HENT:     Head: Normocephalic and atraumatic.     Nose: Nose normal.     Mouth/Throat:     Mouth: Mucous membranes are moist.  Eyes:     Extraocular Movements: Extraocular movements intact.     Pupils: Pupils are equal, round, and reactive to light.  Cardiovascular:     Rate and Rhythm: Normal rate and regular rhythm.     Pulses: Normal pulses.     Heart sounds: Normal heart sounds. No murmur. No friction rub. No gallop.   Pulmonary:     Effort: Pulmonary effort is normal. No respiratory distress.     Breath sounds: Normal breath sounds. No stridor. No wheezing, rhonchi or rales.  Skin:    General: Skin is warm and dry.     Findings: No rash.  Neurological:     Mental Status: She is alert and oriented to person, place, and time.  Psychiatric:        Mood and Affect: Mood normal.        Behavior: Behavior normal.        Thought Content: Thought content normal.     Assessment and Plan :   1. Malaise and fatigue   2. Acute non intractable tension-type headache   3. Loose stools  4. Viral syndrome    Likely viral in etiology d/t physical exam findings. Counseled patient on nature of COVID-19 including modes of transmission, diagnostic testing, management and supportive care.  Advised supportive care, offered symptomatic relief. Counseled patient on potential for adverse effects with medications prescribed/recommended today, ER and return-to-clinic precautions discussed, patient verbalized understanding.     Wallis BambergMani, Montez Stryker, PA-C 04/06/19 1158    Wallis BambergMani, Conlan Miceli, [place] JerseyPA-C 04/06/19 1159

## 2019-04-06 NOTE — Telephone Encounter (Signed)
-----   Message from Jaynee Eagles, [place] sent at 04/06/2019 11:50 AM EDT ----- Regarding: COVID testing

## 2019-04-06 NOTE — Telephone Encounter (Signed)
Called patient to schedule covid testing but she reports that she has already been tested @ the [facility] testing tent today.

## 2019-04-06 NOTE — Discharge Instructions (Addendum)
We will manage this as a viral syndrome.  Please take Tylenol 500mg  every 6 hours. Hydrate very well with at least 2 liters of water. Eat light meals such as soups to replenish electrolytes and soft fruits, veggies. Start an antihistamine like Zyrtec, Allegra or Claritin for any congestion or allergies.

## 2019-04-07 ENCOUNTER — Other Ambulatory Visit: Payer: Managed Care, Other (non HMO)

## 2019-04-07 DIAGNOSIS — R6889 Other general symptoms and signs: Secondary | ICD-10-CM

## 2019-04-12 LAB — NOVEL CORONAVIRUS, NAA: SARS-CoV-2, NAA: NOT DETECTED

## 2019-06-23 ENCOUNTER — Ambulatory Visit (HOSPITAL_COMMUNITY)
Admission: EM | Admit: 2019-06-23 | Discharge: 2019-06-23 | Disposition: A | Discharge status: Home / Self Care | Payer: Managed Care, Other (non HMO) | Attending: Family Medicine | Admitting: Family Medicine

## 2019-06-23 ENCOUNTER — Other Ambulatory Visit: Payer: Self-pay

## 2019-06-23 ENCOUNTER — Encounter (HOSPITAL_COMMUNITY): Payer: Self-pay | Admitting: Family Medicine

## 2019-06-23 DIAGNOSIS — Z20828 Contact with and (suspected) exposure to other viral communicable diseases: Secondary | ICD-10-CM | POA: Insufficient documentation

## 2019-06-23 DIAGNOSIS — J029 Acute pharyngitis, unspecified: Secondary | ICD-10-CM

## 2019-06-23 LAB — POCT RAPID STREP A: Streptococcus, Group A Screen (Direct): NEGATIVE

## 2019-06-23 MED ORDER — CHLORHEXIDINE GLUCONATE 0.12 % MT SOLN: 15.0000 mL | Freq: Two times a day (BID) | OROMUCOSAL | 0 refills | Status: DC

## 2019-06-23 NOTE — Discharge Instructions (Addendum)
The throat test here is negative.  We will run a longer, more accurate test.  In the meantime, start gargles with antiseptic called in, twice daily.

## 2019-06-23 NOTE — ED Provider Notes (Signed)
[place]    CSN: [id number] Arrival date & time: 06/23/19  0865      History   Chief Complaint Chief Complaint  Patient presents with  . Appointment  . (8:30) Sore Throat    HPI Phyllis Burnett is a 23 y.o. female.   23 yo woman, established [facility] patient, presents today for evaluation of dry, uncomfortable throat x 2 days.  Patient has h/o allergies.  Patient is a Engineer, manufacturing systems.  Covid-19 screening is negative for any other symptoms.  No fever, cough, diarrhea.     History reviewed. No pertinent past medical history.  There are no active problems to display for this patient.   History reviewed. No pertinent surgical history.  OB History   No obstetric history on file.      Home Medications    Prior to Admission medications   Medication Sig Start Date End Date Taking? Authorizing Provider  aspirin-acetaminophen-caffeine (EXCEDRIN MIGRAINE) [phone] MG tablet Take 1 tablet by mouth every 6 (six) hours as needed for headache.    [provider]  chlorhexidine (PERIDEX) 0.12 % solution Use as directed 15 mLs in the mouth or throat 2 (two) times daily. 06/23/19   Robyn Haber, MD  VITAMIN D PO Take by mouth.    [provider]    Family History History reviewed. No pertinent family history.  Social History Social History   Tobacco Use  . Smoking status: Never Smoker  . Smokeless tobacco: Never Used  Substance Use Topics  . Alcohol use: Yes  . Drug use: No     Allergies   Patient has no known allergies.   Review of Systems Review of Systems  HENT: Positive for sore throat.   All other systems reviewed and are negative.    Physical Exam Triage Vital Signs ED Triage Vitals  Enc Vitals Group     BP      Pulse      Resp      Temp      Temp src      SpO2      Weight      Height      Head Circumference      Peak Flow      Pain Score      Pain Loc      Pain Edu?      Excl. in [place]?    No data  found.  Updated Vital Signs BP (!) 131/98 (BP Location: Left Arm)   Pulse 91   Temp 98.8 F (37.1 C) (Oral)   Resp 18   LMP 06/03/2019   SpO2 100%    Physical Exam Vitals signs and nursing note reviewed.  Constitutional:      General: She is not in acute distress.    Appearance: Normal appearance. She is normal weight. She is not ill-appearing.  HENT:     Head: Normocephalic.     Nose: Nose normal.     Mouth/Throat:     Mouth: Mucous membranes are moist.     Pharynx: Oropharynx is clear.  Eyes:     Conjunctiva/sclera: Conjunctivae normal.  Neck:     Musculoskeletal: Normal range of motion and neck supple.  Cardiovascular:     Rate and Rhythm: Normal rate.  Pulmonary:     Effort: Pulmonary effort is normal.  Musculoskeletal: Normal range of motion.  Skin:    General: Skin is warm and dry.  Neurological:     General: No  focal deficit present.     Mental Status: She is alert and oriented to person, place, and time.  Psychiatric:        Mood and Affect: Mood normal.        Behavior: Behavior normal.        Thought Content: Thought content normal.        Judgment: Judgment normal.      UC Treatments / Results  Labs (all labs ordered are listed, but only abnormal results are displayed) Labs Reviewed  NOVEL CORONAVIRUS, NAA (HOSP ORDER, SEND-OUT TO REF LAB; TAT 18-24 HRS)    EKG   Radiology No results found.  Procedures Procedures (including critical care time)  Medications Ordered in UC Medications - No data to display  Initial Impression / Assessment and Plan / UC Course  I have reviewed the triage vital signs and the nursing notes.  Pertinent labs & imaging results that were available during my care of the patient were reviewed by me and considered in my medical decision making (see chart for details).    Final Clinical Impressions(s) / UC Diagnoses   Final diagnoses:  Acute pharyngitis, unspecified etiology     Discharge Instructions      The throat test here is negative.  We will run a longer, more accurate test.  In the meantime, start gargles with antiseptic called in, twice daily.    ED Prescriptions    Medication Sig Dispense Auth. Provider   chlorhexidine (PERIDEX) 0.12 % solution Use as directed 15 mLs in the mouth or throat 2 (two) times daily. 120 mL Elvina SidleLauenstein, Elber Galyean, MD     Controlled Substance Prescriptions [place] Controlled Substance Registry consulted? Not Applicable   Elvina SidleLauenstein, Hollis Tuller, MD 06/23/19 [phone]

## 2019-06-23 NOTE — ED Triage Notes (Signed)
Pt presents with dry and sore throat X 2 days.

## 2019-06-24 LAB — NOVEL CORONAVIRUS, NAA (HOSP ORDER, SEND-OUT TO REF LAB; TAT 18-24 HRS): SARS-CoV-2, NAA: NOT DETECTED

## 2019-06-25 ENCOUNTER — Encounter (HOSPITAL_COMMUNITY): Payer: Self-pay

## 2019-06-25 LAB — CULTURE, GROUP A STREP (THRC)

## 2020-01-19 ENCOUNTER — Encounter (HOSPITAL_COMMUNITY): Payer: Self-pay | Admitting: Emergency Medicine

## 2020-01-19 ENCOUNTER — Emergency Department (HOSPITAL_COMMUNITY)
Admission: EM | Admit: 2020-01-19 | Discharge: 2020-01-19 | Disposition: A | Discharge status: Home / Self Care | Payer: Managed Care, Other (non HMO) | Attending: Emergency Medicine | Admitting: Emergency Medicine

## 2020-01-19 ENCOUNTER — Other Ambulatory Visit: Payer: Self-pay

## 2020-01-19 DIAGNOSIS — M545 Low back pain, unspecified: Secondary | ICD-10-CM

## 2020-01-19 DIAGNOSIS — M25552 Pain in left hip: Secondary | ICD-10-CM | POA: Diagnosis not present

## 2020-01-19 DIAGNOSIS — M25532 Pain in left wrist: Secondary | ICD-10-CM

## 2020-01-19 MED ORDER — IBUPROFEN 800 MG PO TABS
800.0000 mg | ORAL_TABLET | Freq: Once | ORAL | Status: AC
  Administered 2020-01-19: 17:00:00 800 mg via ORAL
  Filled 2020-01-19: qty 1

## 2020-01-19 MED ORDER — METHOCARBAMOL 500 MG PO TABS: 500.0000 mg | ORAL_TABLET | Freq: Every evening | ORAL | 0 refills | Status: DC | PRN
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
## 2020-01-19 NOTE — ED Notes (Signed)
Pt's name was called x3 both in the waiting room and outside but no one responsed.

## 2020-01-19 NOTE — Discharge Instructions (Signed)
Take ibuprofen 3 times a day with meals.  Do not take other anti-inflammatories at the same time (Advil, Motrin, naproxen, Aleve). You may supplement with Tylenol if you need further pain control. Use robaxin as needed for muscle stiffness or soreness.  Have caution, this may make you tired or groggy.  Do not drive or operate heavy machinery while taking this medicine. Use ice packs or heating pads if this helps control your pain. You will likely have continued muscle stiffness and soreness over the next couple days.  Follow-up in 1 week if your symptoms are not improving. Return to the emergency room if you develop vision changes, vomiting, slurred speech, numbness, loss of bowel or bladder control, or any new or worsening symptoms.

## 2020-02-06 ENCOUNTER — Other Ambulatory Visit: Payer: Self-pay

## 2020-02-06 ENCOUNTER — Ambulatory Visit (HOSPITAL_COMMUNITY)
Admission: EM | Admit: 2020-02-06 | Discharge: 2020-02-06 | Disposition: A | Discharge status: Home / Self Care | Payer: Managed Care, Other (non HMO)

## 2020-02-06 ENCOUNTER — Emergency Department (HOSPITAL_COMMUNITY)
Admission: EM | Admit: 2020-02-06 | Discharge: 2020-02-06 | Disposition: A | Discharge status: Home / Self Care | Payer: Managed Care, Other (non HMO) | Attending: Emergency Medicine | Admitting: Emergency Medicine

## 2020-02-06 ENCOUNTER — Encounter (HOSPITAL_COMMUNITY): Payer: Self-pay | Admitting: Emergency Medicine

## 2020-02-06 DIAGNOSIS — R519 Headache, unspecified: Secondary | ICD-10-CM | POA: Diagnosis present

## 2020-02-06 DIAGNOSIS — Y999 Unspecified external cause status: Secondary | ICD-10-CM | POA: Diagnosis not present

## 2020-02-06 DIAGNOSIS — S060X0S Concussion without loss of consciousness, sequela: Secondary | ICD-10-CM

## 2020-02-06 DIAGNOSIS — Y939 Activity, unspecified: Secondary | ICD-10-CM | POA: Insufficient documentation

## 2020-02-06 DIAGNOSIS — Y929 Unspecified place or not applicable: Secondary | ICD-10-CM | POA: Insufficient documentation

## 2020-02-06 DIAGNOSIS — S060X0A Concussion without loss of consciousness, initial encounter: Secondary | ICD-10-CM | POA: Diagnosis not present

## 2020-02-06 MED ORDER — ONDANSETRON HCL 4 MG PO TABS: 4.0000 mg | ORAL_TABLET | Freq: Three times a day (TID) | ORAL | 0 refills | Status: DC | PRN
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
## 2020-02-06 NOTE — ED Notes (Signed)
Patient verbalizes understanding of discharge instructions. Opportunity for questioning and answers were provided. Armband removed by staff, pt discharged from ED to home 

## 2020-02-07 ENCOUNTER — Telehealth: Payer: Self-pay | Admitting: Family Medicine

## 2020-02-07 ENCOUNTER — Telehealth: Payer: Self-pay

## 2020-02-07 NOTE — Telephone Encounter (Signed)
Called pt and scheduled her to see Dr. Denyse Amass on Thursday, 02/10/20.  She has been having HA, change in appetite, sensitivity to light and some difficulty staying asleep.
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
## 2020-02-07 NOTE — Telephone Encounter (Signed)
Patient called requesting to schedule a concussion visit. She was seen in the ED and was referred here for follow up.

## 2020-02-10 ENCOUNTER — Other Ambulatory Visit: Payer: Self-pay

## 2020-02-10 ENCOUNTER — Encounter: Payer: Self-pay | Admitting: Family Medicine

## 2020-02-10 ENCOUNTER — Ambulatory Visit (INDEPENDENT_AMBULATORY_CARE_PROVIDER_SITE_OTHER): Payer: Managed Care, Other (non HMO) | Admitting: Family Medicine

## 2020-02-10 VITALS — BP 118/70 | HR 64 | Ht 66.0 in | Wt 184.6 lb

## 2020-02-10 DIAGNOSIS — S060X0A Concussion without loss of consciousness, initial encounter: Secondary | ICD-10-CM

## 2020-02-10 MED ORDER — NORTRIPTYLINE HCL 25 MG PO CAPS: 25.0000 mg | ORAL_CAPSULE | Freq: Every day | ORAL | 2 refills | Status: DC

## 2020-02-10 NOTE — Patient Instructions (Addendum)
Thank you for coming in today. Take nortriptyline at bedtime.  We will figure out job duties.  Let me know if I need the change the note. Let me know what it needs to say.   Let me know if the medicine is not helpful or too annoying.  Dry mouth and fatigue are side effects.
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
## 2020-02-15 ENCOUNTER — Encounter: Payer: Self-pay | Admitting: Family Medicine

## 2020-02-15 NOTE — Telephone Encounter (Signed)
Copy sent to scan and on hold up front for patient.

## 2020-02-24 ENCOUNTER — Ambulatory Visit: Payer: Managed Care, Other (non HMO) | Admitting: Family Medicine

## 2020-02-24 ENCOUNTER — Other Ambulatory Visit: Payer: Self-pay

## 2020-02-24 ENCOUNTER — Encounter: Payer: Self-pay | Admitting: Family Medicine

## 2020-02-24 VITALS — BP 100/66 | Ht 66.0 in | Wt 184.0 lb

## 2020-02-24 DIAGNOSIS — S060X0D Concussion without loss of consciousness, subsequent encounter: Secondary | ICD-10-CM

## 2020-02-24 DIAGNOSIS — G44319 Acute post-traumatic headache, not intractable: Secondary | ICD-10-CM

## 2020-02-24 MED ORDER — TOPIRAMATE 50 MG PO TABS: ORAL_TABLET | ORAL | 1 refills | Status: DC

## 2020-02-24 NOTE — Patient Instructions (Addendum)
Return for recheck prior to June 20th.  Try topamax for headache prevention.  If helping a little in 2 weeks let me know and I can increase the dose.   Topiramate tablets What is this medicine? TOPIRAMATE (toe PYRE a mate) is used to treat seizures in adults or children with epilepsy. It is also used for the prevention of migraine headaches. This medicine may be used for other purposes; ask your health care provider or pharmacist if you have questions. COMMON BRAND NAME(S): Topamax, Topiragen What should I tell my health care provider before I take this medicine? They need to know if you have any of these conditions:  bleeding disorders  kidney disease  lung or breathing disease, like asthma  suicidal thoughts, plans, or attempt; a previous suicide attempt by you or a family member  an unusual or allergic reaction to topiramate, other medicines, foods, dyes, or preservatives  pregnant or trying to get pregnant  breast-feeding How should I use this medicine? Take this medicine by mouth with a glass of water. Follow the directions on the prescription label. Do not cut, crush or chew this medicine. Swallow the tablets whole. You can take it with or without food. If it upsets your stomach, take it with food. Take your medicine at regular intervals. Do not take it more often than directed. Do not stop taking except on your doctor's advice. A special MedGuide will be given to you by the pharmacist with each prescription and refill. Be sure to read this information carefully each time. Talk to your pediatrician regarding the use of this medicine in children. While this drug may be prescribed for children as young as 53 years of age for selected conditions, precautions do apply. Overdosage: If you think you have taken too much of this medicine contact a poison control center or emergency room at once. NOTE: This medicine is only for you. Do not share this medicine with others. What if I miss a  dose? If you miss a dose, take it as soon as you can. If your next dose is to be taken in less than 6 hours, then do not take the missed dose. Take the next dose at your regular time. Do not take double or extra doses. What may interact with this medicine? This medicine may interact with the following medications:  acetazolamide  alcohol  antihistamines for allergy, cough, and cold  aspirin and aspirin-like medicines  atropine  birth control pills  certain medicines for anxiety or sleep  certain medicines for bladder problems like oxybutynin, tolterodine  certain medicines for depression like amitriptyline, fluoxetine, sertraline  certain medicines for seizures like carbamazepine, phenobarbital, phenytoin, primidone, valproic acid, zonisamide  certain medicines for stomach problems like dicyclomine, hyoscyamine  certain medicines for travel sickness like scopolamine  certain medicines for Parkinson's disease like benztropine, trihexyphenidyl  certain medicines that treat or prevent blood clots like warfarin, enoxaparin, dalteparin, apixaban, dabigatran, and rivaroxaban  digoxin  general anesthetics like halothane, isoflurane, methoxyflurane, propofol  hydrochlorothiazide  ipratropium  lithium  medicines that relax muscles for surgery  metformin  narcotic medicines for pain  NSAIDs, medicines for pain and inflammation, like ibuprofen or naproxen  phenothiazines like chlorpromazine, mesoridazine, prochlorperazine, thioridazine  pioglitazone This list may not describe all possible interactions. Give your health care provider a list of all the medicines, herbs, non-prescription drugs, or dietary supplements you use. Also tell them if you smoke, drink alcohol, or use illegal drugs. Some items may interact with your medicine.  What should I watch for while using this medicine? Visit your doctor or health care professional for regular checks on your progress. Tell your  health care professional if your symptoms do not start to get better or if they get worse. Do not stop taking except on your health care professional's advice. You may develop a severe reaction. Your health care professional will tell you how much medicine to take. Wear a medical ID bracelet or chain. Carry a card that describes your disease and details of your medicine and dosage times. This medicine can reduce the response of your body to heat or cold. Dress warm in cold weather and stay hydrated in hot weather. If possible, avoid extreme temperatures like saunas, hot tubs, very hot or cold showers, or activities that can cause dehydration such as vigorous exercise. Check with your health care professional if you have severe diarrhea, nausea, and vomiting, or if you sweat a lot. The loss of too much body fluid may make it dangerous for you to take this medicine. You may get drowsy or dizzy. Do not drive, use machinery, or do anything that needs mental alertness until you know how this medicine affects you. Do not stand up or sit up quickly, especially if you are an older patient. This reduces the risk of dizzy or fainting spells. Alcohol may interfere with the effect of this medicine. Avoid alcoholic drinks. Tell your health care professional right away if you have any change in your eyesight. Patients and their families should watch out for new or worsening depression or thoughts of suicide. Also watch out for sudden changes in feelings such as feeling anxious, agitated, panicky, irritable, hostile, aggressive, impulsive, severely restless, overly excited and hyperactive, or not being able to sleep. If this happens, especially at the beginning of treatment or after a change in dose, call your healthcare professional. This medicine may cause serious skin reactions. They can happen weeks to months after starting the medicine. Contact your health care provider right away if you notice fevers or flu-like  symptoms with a rash. The rash may be red or purple and then turn into blisters or peeling of the skin. Or, you might notice a red rash with swelling of the face, lips or lymph nodes in your neck or under your arms. Birth control may not work properly while you are taking this medicine. Talk to your health care professional about using an extra method of birth control. Women should inform their health care professional if they wish to become pregnant or think they might be pregnant. There is a potential for serious side effects and harm to an unborn child. Talk to your health care professional for more information. What side effects may I notice from receiving this medicine? Side effects that you should report to your doctor or health care professional as soon as possible:  allergic reactions like skin rash, itching or hives, swelling of the face, lips, or tongue  blood in the urine  changes in vision  confusion  loss of memory  pain in lower back or side  pain when urinating  redness, blistering, peeling or loosening of the skin, including inside the mouth  signs and symptoms of bleeding such as bloody or black, tarry stools; red or dark brown urine; spitting up blood or brown material that looks like coffee grounds; red spots on the skin; unusual bruising or bleeding from the eyes, gums, or nose  signs and symptoms of increased acid in the body like  breathing fast; fast heartbeat; headache; confusion; unusually weak or tired; nausea, vomiting  suicidal thoughts, mood changes  trouble speaking or understanding  unusual sweating  unusually weak or tired Side effects that usually do not require medical attention (report to your doctor or health care professional if they continue or are bothersome):  dizziness  drowsiness  fever  loss of appetite  nausea, vomiting  pain, tingling, numbness in the hands or feet  stomach pain  tiredness  upset stomach This list may not  describe all possible side effects. Call your doctor for medical advice about side effects. You may report side effects to FDA at 1-800-FDA-1088. Where should I keep my medicine? Keep out of the reach of children. Store at room temperature between 15 and 30 degrees C (59 and 86 degrees F). Throw away any unused medicine after the expiration date. NOTE: This sheet is a summary. It may not cover all possible information. If you have questions about this medicine, talk to your doctor, pharmacist, or health care provider.  2020 Elsevier/Gold Standard (2019-04-22 15:07:20)
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
## 2020-03-16 ENCOUNTER — Ambulatory Visit (INDEPENDENT_AMBULATORY_CARE_PROVIDER_SITE_OTHER): Payer: Managed Care, Other (non HMO) | Admitting: Family Medicine

## 2020-03-16 ENCOUNTER — Encounter: Payer: Self-pay | Admitting: Family Medicine

## 2020-03-16 ENCOUNTER — Other Ambulatory Visit: Payer: Self-pay

## 2020-03-16 VITALS — BP 124/70 | Ht 66.0 in | Wt 181.0 lb

## 2020-03-16 DIAGNOSIS — S060X0D Concussion without loss of consciousness, subsequent encounter: Secondary | ICD-10-CM
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
## 2020-03-16 NOTE — Patient Instructions (Addendum)
Thank you for coming in today.  I will get those forms done ASAP.  Send them to me.  Cleared to return to training.  Continue topirmate  Your pill bottle on 5/20 has 30 pills and 1 refill.  If this becomes a chronic medicine let me know and I can send in 90 days with refills.

## 2020-03-20 ENCOUNTER — Encounter: Payer: Self-pay | Admitting: Family Medicine

## 2020-03-21 ENCOUNTER — Encounter: Payer: Self-pay | Admitting: Family Medicine

## 2020-03-29 ENCOUNTER — Ambulatory Visit (HOSPITAL_COMMUNITY)
Admission: EM | Admit: 2020-03-29 | Discharge: 2020-03-29 | Disposition: A | Discharge status: Home / Self Care | Payer: Managed Care, Other (non HMO) | Attending: Family Medicine | Admitting: Family Medicine

## 2020-03-29 ENCOUNTER — Encounter (HOSPITAL_COMMUNITY): Payer: Self-pay

## 2020-03-29 ENCOUNTER — Other Ambulatory Visit: Payer: Self-pay

## 2020-03-29 DIAGNOSIS — Z113 Encounter for screening for infections with a predominantly sexual mode of transmission: Secondary | ICD-10-CM | POA: Insufficient documentation

## 2020-03-29 NOTE — Discharge Instructions (Signed)
   Your swab tests are pending.  If your test results are positive, we will call you.  You may need additional treatment and your partner(s) may also need treatment.      

## 2020-03-29 NOTE — ED Triage Notes (Signed)
Patient here requesting STD testing. States her partner is experiencing a burning sensation with urination. Patient denies any discharge, odor, or pain.

## 2020-03-29 NOTE — ED Provider Notes (Signed)
[facility] [place]   [id number] 03/29/20 Arrival Time: 6160   CC: VAGINAL DISCHARGE  SUBJECTIVE:  Phyllis Burnett is a 24 y.o. female who presents for STD screening. Declines HIV/RPR screen today. Reports that she is asymptomatic. Reports that her partner is experiencing dysuria. Reports that her partner has not been medically evaluated. She denies fever, chills, nausea, vomiting, abdominal or pelvic pain, urinary symptoms, vaginal itching, vaginal odor, vaginal bleeding, dyspareunia, vaginal rashes or lesions.   Patient's last menstrual period was 03/26/2020 (exact date). Current birth control method: Compliant with BC:  ROS: As per HPI.  All other pertinent ROS negative.     History reviewed. No pertinent past medical history. History reviewed. No pertinent surgical history. No Known Allergies No current facility-administered medications on file prior to encounter.   Current Outpatient Medications on File Prior to Encounter  Medication Sig Dispense Refill   aspirin-acetaminophen-caffeine (EXCEDRIN MIGRAINE) 250-250-65 MG tablet Take 1 tablet by mouth every 6 (six) hours as needed for headache.     ibuprofen (ADVIL) 200 MG tablet Take 200 mg by mouth every 6 (six) hours as needed.     topiramate (TOPAMAX) 50 MG tablet One half tab by mouth daily for a week, then one tab by mouth daily. 30 tablet 1   VITAMIN D PO Take by mouth.      Social History   Socioeconomic History   Marital status: Soil scientist    Spouse name: Not on file   Number of children: Not on file   Years of education: Not on file   Highest education level: Not on file  Occupational History   Not on file  Tobacco Use   Smoking status: Never Smoker   Smokeless tobacco: Never Used  Substance and Sexual Activity   Alcohol use: Yes   Drug use: No   Sexual activity: Not on file  Other Topics Concern   Not on file  Social History Narrative   Not on file   Social Determinants of  Health   Financial Resource Strain:    Difficulty of Paying Living Expenses:   Food Insecurity:    Worried About Charity fundraiser in the Last Year:    Arboriculturist in the Last Year:   Transportation Needs:    Film/video editor (Medical):    Lack of Transportation (Non-Medical):   Physical Activity:    Days of Exercise per Week:    Minutes of Exercise per Session:   Stress:    Feeling of Stress :   Social Connections:    Frequency of Communication with Friends and Family:    Frequency of Social Gatherings with Friends and Family:    Attends Religious Services:    Active Member of Clubs or Organizations:    Attends Music therapist:    Marital Status:   Intimate Partner Violence:    Fear of Current or Ex-Partner:    Emotionally Abused:    Physically Abused:    Sexually Abused:    History reviewed. No pertinent family history.  OBJECTIVE:  Vitals:   03/29/20 1230  BP: 119/73  Pulse: 63  Resp: 12  Temp: 97.8 F (36.6 C)  SpO2: 100%     General appearance: Alert, NAD, appears stated age Head: NCAT Throat: lips, mucosa, and tongue normal; teeth and gums normal Lungs: CTA bilaterally without adventitious breath sounds Heart: regular rate and rhythm.  Radial pulses 2+ symmetrical bilaterally Back: no CVA tenderness Abdomen: soft, non-tender; bowel  sounds normal; no masses or organomegaly; no guarding or rebound tenderness GU: declines, vaginal self swab obtained Skin: warm and dry Psychological:  Alert and cooperative. Normal mood and affect.  LABS:  Results for orders placed or performed during the hospital encounter of 06/23/19  Novel Coronavirus, NAA (Hosp order, Send-out to Ref Lab; TAT 18-24 hrs   Specimen: Nasopharyngeal Swab; Respiratory  Result Value Ref Range   SARS-CoV-2, NAA NOT DETECTED NOT DETECTED   Coronavirus Source NASOPHARYNGEAL   Culture, group A strep   Specimen: Throat  Result Value Ref Range    Specimen Description THROAT    Special Requests NONE    Culture      NO GROUP A STREP (S.PYOGENES) ISOLATED Performed at [facility], [address], [place], [place] [address]    Report Status 06/25/2019 FINAL   POCT rapid strep A [facility])  Result Value Ref Range   Streptococcus, Group A Screen (Direct) NEGATIVE NEGATIVE    Labs Reviewed  CERVICOVAGINAL ANCILLARY ONLY    ASSESSMENT & PLAN:  1. Screen for STD (sexually transmitted disease)     No orders of the defined types were placed in this encounter.   Pending: Labs Reviewed  CERVICOVAGINAL ANCILLARY ONLY   STD Screen  Vaginal self-swab obtained.  We will follow up with you regarding abnormal results Declines HIV/ syphilis testing today  If tests results are positive, please abstain from sexual activity until you and your partner(s) have been treated Follow up with PCP or [facility] if symptoms persists Return here or go to ER if you have any new or worsening symptoms fever, chills, nausea, vomiting, abdominal or pelvic pain, painful intercourse, vaginal discharge, vaginal bleeding, persistent symptoms despite treatment Reviewed expectations re: course of current medical issues. Questions answered. Outlined signs and symptoms indicating need for more acute intervention. Patient verbalized understanding. After Visit Summary given.        Moshe Cipro, NP 03/29/20 1242

## 2020-03-30 LAB — CERVICOVAGINAL ANCILLARY ONLY
Bacterial Vaginitis (gardnerella): POSITIVE — AB
Candida Glabrata: NEGATIVE
Candida Vaginitis: POSITIVE — AB
Chlamydia: NEGATIVE
Comment: NEGATIVE
Comment: NEGATIVE
Comment: NEGATIVE
Comment: NEGATIVE
Comment: NEGATIVE
Comment: NORMAL
Neisseria Gonorrhea: NEGATIVE
Trichomonas: NEGATIVE

## 2020-03-31 ENCOUNTER — Telehealth (HOSPITAL_COMMUNITY): Payer: Self-pay | Admitting: Orthopedic Surgery

## 2020-03-31 MED ORDER — METRONIDAZOLE 500 MG PO TABS: 500.0000 mg | ORAL_TABLET | Freq: Two times a day (BID) | ORAL | 0 refills | Status: DC

## 2020-03-31 MED ORDER — FLUCONAZOLE 150 MG PO TABS: 150.0000 mg | ORAL_TABLET | Freq: Every day | ORAL | 0 refills | Status: AC

## 2020-07-19 ENCOUNTER — Other Ambulatory Visit: Payer: Self-pay | Admitting: Obstetrics and Gynecology

## 2020-07-19 DIAGNOSIS — Z3162 Encounter for fertility preservation counseling: Secondary | ICD-10-CM

## 2020-08-07 ENCOUNTER — Other Ambulatory Visit: Payer: Managed Care, Other (non HMO)

## 2020-10-21 ENCOUNTER — Encounter (HOSPITAL_COMMUNITY): Payer: Self-pay | Admitting: Emergency Medicine

## 2020-10-21 ENCOUNTER — Ambulatory Visit (HOSPITAL_COMMUNITY)
Admission: EM | Admit: 2020-10-21 | Discharge: 2020-10-21 | Disposition: A | Discharge status: Home / Self Care | Payer: BC Managed Care – PPO

## 2020-10-21 ENCOUNTER — Other Ambulatory Visit: Payer: Self-pay

## 2020-10-21 DIAGNOSIS — R519 Headache, unspecified: Secondary | ICD-10-CM

## 2020-10-21 DIAGNOSIS — R5383 Other fatigue: Secondary | ICD-10-CM | POA: Diagnosis not present

## 2020-10-21 NOTE — Discharge Instructions (Addendum)
Your COVID test is pending.  You should self quarantine until the test result is back.    Take Tylenol or ibuprofen as needed for fever or discomfort.  Rest and keep yourself hydrated.    Follow-up with your primary care provider if your symptoms are not improving.     

## 2020-10-21 NOTE — ED Triage Notes (Signed)
Pt presents with headache, fatigue, and dryness in throat xs 1 week. States was tested last week with negative result.

## 2020-10-21 NOTE — ED Notes (Signed)
Lab called clinic due to inability to run coronavirus test. Patient was contacted to come back to clinic to be retested. Patient stated they will come tomorrow or on Monday for testing.

## 2020-10-21 NOTE — ED Provider Notes (Signed)
[facility]    CSN: [id number] Arrival date & time: 10/21/20  1416      History   Chief Complaint Chief Complaint  Patient presents with  . Headache  . Fatigue    HPI Phyllis Burnett is a 25 y.o. female.   Patient presents with 1 week history of fatigue, headache, dry throat.  She denies fever, chills, rash, cough, shortness of breath, vomiting, diarrhea, or other symptoms.  She had a negative COVID test 8 days ago.  OTC treatment attempted at home.  She denies pertinent medical history.  The history is provided by the patient.    History reviewed. No pertinent past medical history.  Patient Active Problem List   Diagnosis Date Noted  . Family history of breast cancer 10/08/2017    History reviewed. No pertinent surgical history.  OB History   No obstetric history on file.      Home Medications    Prior to Admission medications   Medication Sig Start Date End Date Taking? Authorizing Provider  aspirin-acetaminophen-caffeine (EXCEDRIN MIGRAINE) [phone] MG tablet Take 1 tablet by mouth every 6 (six) hours as needed for headache.    [provider]  ibuprofen (ADVIL) 200 MG tablet Take 200 mg by mouth every 6 (six) hours as needed.    [provider]  metroNIDAZOLE (FLAGYL) 500 MG tablet Take 1 tablet (500 mg total) by mouth 2 (two) times daily. 03/31/20   Merrilee Jansky, MD  topiramate (TOPAMAX) 50 MG tablet One half tab by mouth daily for a week, then one tab by mouth daily. 02/24/20   Rodolph Bong, MD  VITAMIN D PO Take by mouth.    [provider]    Family History History reviewed. No pertinent family history.  Social History Social History   Tobacco Use  . Smoking status: Never Smoker  . Smokeless tobacco: Never Used  Substance Use Topics  . Alcohol use: Yes  . Drug use: No     Allergies   Patient has no known allergies.   Review of Systems Review of Systems  Constitutional: Positive for fatigue.  Negative for chills and fever.  HENT: Negative for ear pain and sore throat.   Eyes: Negative for pain and visual disturbance.  Respiratory: Negative for cough and shortness of breath.   Cardiovascular: Negative for chest pain and palpitations.  Gastrointestinal: Negative for abdominal pain, diarrhea and vomiting.  Genitourinary: Negative for dysuria and hematuria.  Musculoskeletal: Negative for arthralgias and back pain.  Skin: Negative for color change and rash.  Neurological: Positive for headaches. Negative for syncope, weakness and numbness.  All other systems reviewed and are negative.    Physical Exam Triage Vital Signs ED Triage Vitals  Enc Vitals Group     BP      Pulse      Resp      Temp      Temp src      SpO2      Weight      Height      Head Circumference      Peak Flow      Pain Score      Pain Loc      Pain Edu?      Excl. in [place]?    No data found.  Updated Vital Signs BP 140/87 (BP Location: Right Arm)   Pulse 88   Temp 98.4 F (36.9 C) (Oral)   Resp 17   LMP 10/15/2020  SpO2 100%   Visual Acuity Right Eye Distance:   Left Eye Distance:   Bilateral Distance:    Right Eye Near:   Left Eye Near:    Bilateral Near:     Physical Exam Vitals and nursing note reviewed.  Constitutional:      General: She is not in acute distress.    Appearance: She is well-developed and well-nourished. She is not ill-appearing.  HENT:     Head: Normocephalic and atraumatic.     Right Ear: Tympanic membrane normal.     Left Ear: Tympanic membrane normal.     Nose: Nose normal.     Mouth/Throat:     Mouth: Mucous membranes are moist.     Pharynx: Oropharynx is clear.  Eyes:     Conjunctiva/sclera: Conjunctivae normal.  Cardiovascular:     Rate and Rhythm: Normal rate and regular rhythm.     Heart sounds: Normal heart sounds.  Pulmonary:     Effort: Pulmonary effort is normal. No respiratory distress.     Breath sounds: Normal breath sounds.  Abdominal:      Palpations: Abdomen is soft.     Tenderness: There is no abdominal tenderness.  Musculoskeletal:        General: No edema.     Cervical back: Neck supple.  Skin:    General: Skin is warm and dry.  Neurological:     General: No focal deficit present.     Mental Status: She is alert and oriented to person, place, and time.     Gait: Gait normal.  Psychiatric:        Mood and Affect: Mood and affect and mood normal.        Behavior: Behavior normal.      UC Treatments / Results  Labs (all labs ordered are listed, but only abnormal results are displayed) Labs Reviewed  SARS CORONAVIRUS 2 (TAT 6-24 HRS)    EKG   Radiology No results found.  Procedures Procedures (including critical care time)  Medications Ordered in UC Medications - No data to display  Initial Impression / Assessment and Plan / UC Course  I have reviewed the triage vital signs and the nursing notes.  Pertinent labs & imaging results that were available during my care of the patient were reviewed by me and considered in my medical decision making (see chart for details).   Fatigue, Headache.  COVID pending.  Instructed patient to self quarantine until the test results are back.  Discussed symptomatic treatment including Tylenol, rest, hydration.  Instructed patient to follow up with PCP if her symptoms are not improving.  Patient agrees to plan of care.    Final Clinical Impressions(s) / UC Diagnoses   Final diagnoses:  Fatigue, unspecified type  Acute nonintractable headache, unspecified headache type     Discharge Instructions     Your COVID test is pending.  You should self quarantine until the test result is back.    Take Tylenol or ibuprofen as needed for fever or discomfort.  Rest and keep yourself hydrated.    Follow-up with your primary care provider if your symptoms are not improving.        ED Prescriptions    None     PDMP not reviewed this encounter.   Mickie Bail,  NP 10/21/20 [phone]

## 2020-10-25 ENCOUNTER — Ambulatory Visit
Admission: RE | Admit: 2020-10-25 | Discharge: 2020-10-25 | Disposition: A | Discharge status: Home / Self Care | Payer: Managed Care, Other (non HMO) | Source: Ambulatory Visit | Attending: Obstetrics and Gynecology | Admitting: Obstetrics and Gynecology

## 2020-10-25 DIAGNOSIS — Z3162 Encounter for fertility preservation counseling: Secondary | ICD-10-CM

## 2020-11-03 ENCOUNTER — Emergency Department (HOSPITAL_COMMUNITY)
Admission: EM | Admit: 2020-11-03 | Discharge: 2020-11-03 | Disposition: A | Discharge status: Home / Self Care | Payer: BC Managed Care – PPO | Attending: Emergency Medicine | Admitting: Emergency Medicine

## 2020-11-03 ENCOUNTER — Encounter (HOSPITAL_COMMUNITY): Payer: Self-pay | Admitting: Emergency Medicine

## 2020-11-03 DIAGNOSIS — Z20822 Contact with and (suspected) exposure to covid-19: Secondary | ICD-10-CM | POA: Insufficient documentation

## 2020-11-03 DIAGNOSIS — R531 Weakness: Secondary | ICD-10-CM | POA: Diagnosis present

## 2020-11-03 DIAGNOSIS — R519 Headache, unspecified: Secondary | ICD-10-CM | POA: Diagnosis not present

## 2020-11-03 DIAGNOSIS — Z7982 Long term (current) use of aspirin: Secondary | ICD-10-CM | POA: Diagnosis not present

## 2020-11-03 LAB — PREGNANCY, URINE: Preg Test, Ur: NEGATIVE

## 2020-11-03 LAB — I-STAT CHEM 8, ED
BUN: 11 mg/dL (ref 6–20)
Calcium, Ion: 1.21 mmol/L (ref 1.15–1.40)
Chloride: 100 mmol/L (ref 98–111)
Creatinine, Ser: 0.8 mg/dL (ref 0.44–1.00)
Glucose, Bld: 83 mg/dL (ref 70–99)
HCT: 46 % (ref 36.0–46.0)
Hemoglobin: 15.6 g/dL — ABNORMAL HIGH (ref 12.0–15.0)
Potassium: 4 mmol/L (ref 3.5–5.1)
Sodium: 138 mmol/L (ref 135–145)
TCO2: 28 mmol/L (ref 22–32)

## 2020-11-03 LAB — CBG MONITORING, ED: Glucose-Capillary: 103 mg/dL — ABNORMAL HIGH (ref 70–99)

## 2020-11-03 LAB — TSH: TSH: 2.9 u[IU]/mL (ref 0.350–4.500)

## 2020-11-03 NOTE — Discharge Instructions (Signed)
Please follow-up with your primary care physician, please keep the appointment that you have made for further work-up.  Your thyroid function looks normal and does not appear to be the reason your more fatigued.  Your electrolytes that could cause some weakness and numbness look good as well.  Use Tylenol and ibuprofen as needed for headaches.  Return if you develop severe headache, difficulty seeing that does not improve with blinking, or prolonged weakness in your extremities.

## 2020-11-03 NOTE — ED Triage Notes (Signed)
Patient complains of bilateral arm weakness when she wakes each day, daily headaches, and blurry right eye for the last three weeks. Patient alert, oriented, ambulatory and in no apparent distress.

## 2020-11-03 NOTE — ED Provider Notes (Signed)
[facility] EMERGENCY DEPARTMENT Provider Note   CSN: [id number] Arrival date & time: 11/03/20  1829     History Chief Complaint  Patient presents with  . Extremity Weakness    Paulett ALLAYNA ERLICH is a 25 y.o. female.  The history is provided by the patient.  Weakness Severity:  Mild Onset quality:  Gradual Duration:  3 weeks Timing:  Intermittent Progression:  Waxing and waning Chronicity:  Recurrent Relieved by: time. Exacerbated by: sleeping, worse in the AM. Associated symptoms: headaches   Associated symptoms: no abdominal pain, no arthralgias, no chest pain, no cough, no dysuria, no fever, no seizures, no shortness of breath and no vomiting   Headaches:    Severity:  Mild   Onset quality:  Gradual   Duration:  3 weeks   Timing:  Intermittent   Progression:  Waxing and waning   Chronicity:  Recurrent      History reviewed. No pertinent past medical history.  Patient Active Problem List   Diagnosis Date Noted  . Family history of breast cancer 10/08/2017    History reviewed. No pertinent surgical history.   OB History   No obstetric history on file.     No family history on file.  Social History   Tobacco Use  . Smoking status: Never Smoker  . Smokeless tobacco: Never Used  Substance Use Topics  . Alcohol use: Yes  . Drug use: No    Home Medications Prior to Admission medications   Medication Sig Start Date End Date Taking? Authorizing Provider  aspirin-acetaminophen-caffeine (EXCEDRIN MIGRAINE) [phone] MG tablet Take 1 tablet by mouth every 6 (six) hours as needed for headache.    [provider]  ibuprofen (ADVIL) 200 MG tablet Take 200 mg by mouth every 6 (six) hours as needed.    [provider]  metroNIDAZOLE (FLAGYL) 500 MG tablet Take 1 tablet (500 mg total) by mouth 2 (two) times daily. 03/31/20   Merrilee Jansky, MD  topiramate (TOPAMAX) 50 MG tablet One half tab by mouth daily for a week, then  one tab by mouth daily. 02/24/20   Rodolph Bong, MD  VITAMIN D PO Take by mouth.    [provider]    Allergies    Patient has no known allergies.  Review of Systems   Review of Systems  Constitutional: Negative for chills and fever.  HENT: Negative for ear pain and sore throat.   Eyes: Positive for visual disturbance. Negative for pain.  Respiratory: Negative for cough and shortness of breath.   Cardiovascular: Negative for chest pain and palpitations.  Gastrointestinal: Negative for abdominal pain and vomiting.  Genitourinary: Negative for dysuria and hematuria.  Musculoskeletal: Negative for arthralgias and back pain.  Skin: Negative for color change and rash.  Neurological: Positive for weakness and headaches. Negative for seizures and syncope.  All other systems reviewed and are negative.   Physical Exam Updated Vital Signs BP 124/79 (BP Location: Left Arm)   Pulse 72   Temp 98.8 F (37.1 C) (Oral)   Resp 15   Ht 5\' 6"  (1.676 m)   Wt 76.2 kg   LMP 10/15/2020   SpO2 98%   BMI 27.12 kg/m   Physical Exam Vitals and nursing note reviewed.  Constitutional:      General: She is not in acute distress.    Appearance: She is well-developed and well-nourished.  HENT:     Head: Normocephalic and atraumatic.  Eyes:  Conjunctiva/sclera: Conjunctivae normal.  Cardiovascular:     Rate and Rhythm: Normal rate and regular rhythm.     Heart sounds: No murmur heard.   Pulmonary:     Effort: Pulmonary effort is normal. No respiratory distress.     Breath sounds: Normal breath sounds.  Abdominal:     Palpations: Abdomen is soft.     Tenderness: There is no abdominal tenderness.  Musculoskeletal:        General: No edema.     Cervical back: Neck supple. No rigidity.  Skin:    General: Skin is warm and dry.  Neurological:     General: No focal deficit present.     Mental Status: She is alert.     GCS: GCS eye subscore is 4. GCS verbal subscore is 5. GCS  motor subscore is 6.     Cranial Nerves: Cranial nerves are intact.     Sensory: Sensation is intact.     Motor: Motor function is intact.     Coordination: Coordination is intact. Romberg sign negative. Finger-Nose-Finger Test normal.     Gait: Gait is intact. Gait normal.  Psychiatric:        Mood and Affect: Mood and affect normal.     ED Results / Procedures / Treatments   Labs (all labs ordered are listed, but only abnormal results are displayed) Labs Reviewed  I-STAT CHEM 8, ED - Abnormal; Notable for the following components:      Result Value   Hemoglobin 15.6 (*)    All other components within normal limits  CBG MONITORING, ED - Abnormal; Notable for the following components:   Glucose-Capillary 103 (*)    All other components within normal limits  SARS CORONAVIRUS 2 (TAT 6-24 HRS)  PREGNANCY, URINE  TSH    EKG None  Radiology No results found.  Procedures Procedures   Medications Ordered in ED Medications - No data to display  ED Course  I have reviewed the triage vital signs and the nursing notes.  Pertinent labs & imaging results that were available during my care of the patient were reviewed by me and considered in my medical decision making (see chart for details).    MDM Rules/Calculators/A&P                         This is a 25 year old female with a reported past medical history of migraines in which she is no longer on any medication for who presents emergency department for evaluation of upper extremity weakness is most noticeable in the morning upon awakening that resolves throughout the day, intermittent headaches, and right eye blurred vision at times that improves with blinking.  Patient reports that over the last 3 to 4 weeks she has noticed she has been more fatigued, when she wakes up in the morning she feels that her arms are heavy and it takes a few minutes to an hour to improve to her baseline, and also has had generalized, mild, gradual  onset headaches that wax and wane, does not currently have a headache.  She denies any fever, neck pain, recent head trauma, recent manipulation of her neck.  She denies pregnancy as she had a negative pregnancy test and underwent a gynecologic procedure to evaluate fertility approximately 1 week ago.  She is a trying to establish with a primary care physician, but unfortunately it was going to be a few weeks and therefore she came to the emergency department  for further evaluation.  On exam the patient is afebrile, hemodynamically stable, no acute distress.  Neurologically she is nonfocal without weakness, sensory changes.  Visual acuity is within normal limits.  She has normal peripheral vision in all visual fields.  She has not exhibited any proximal muscle weakness.  Basic labs were obtained with chemistry to evaluate for electrolytes that show no abnormalities.  TSH is within normal limits and does not explain her recent fatigue over the last few weeks.  I believe her arm pain to be related to the way she sleeps that she usually sleeps on her stomach with her arms above her head and her arms feel weak in the morning then quickly resolved.  Her description of her blurry vision in the right eye that improves with blinking and could be related to some dry eye as her visual acuities are normal.  She does not have any headache currently or neck pain concerning me for meningitis/encephalitis.  Headache does not awaken her from sleep and is not worse in the morning, doubt intracranial abnormality given her reassuring exam and no headache at this time.  No indication for imaging, admission, or further work-up as she is at her baseline experiencing no symptoms and has a nonfocal and reassuring exam.  We will have her follow-up as scheduled with her primary care physician and return to the emergency department if she experiences any return of symptoms.  We did discuss after 7 the primary care physician that she  may require referral to a neurologist given her neurologic symptoms.  Final Clinical Impression(s) / ED Diagnoses Final diagnoses:  Weakness    Rx / DC Orders ED Discharge Orders    None       Kathleen Lime, MD 11/04/20 Valentina Lucks    Eber Hong, MD 11/05/20 1504

## 2020-11-04 LAB — SARS CORONAVIRUS 2 (TAT 6-24 HRS): SARS Coronavirus 2: NEGATIVE

## 2021-04-08 IMAGING — RF DG HYSTEROGRAM
5 series · 14 of 16 positions shown · IV contrast (omnipaque)
Comparison: None.

CLINICAL DATA: Encounter for fertility preservation counseling

EXAM:
HYSTEROSALPINGOGRAM
TECHNIQUE: Following cleansing of the cervix and vagina with Betadine solution,
a hysterosalpingogram was performed using a 5-French
hysterosalpingogram catheter and Omnipaque 300 contrast. The patient
tolerated the examination without difficulty.

[Series 1: one shot · 2 of 2 slices shown (1 of 2)]
[im 1/2]
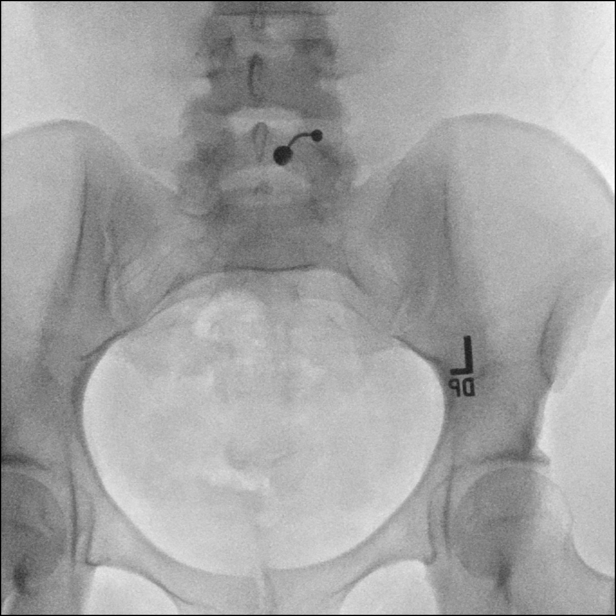
[im 2/2]
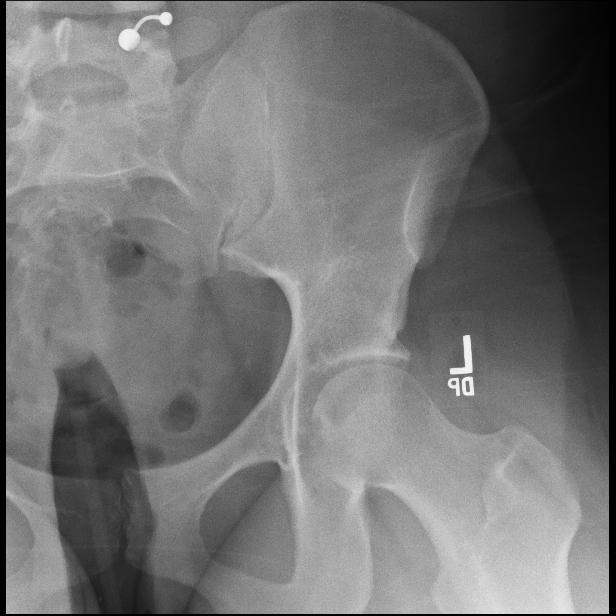

[Series 2: sequence · 3 of 26 frames shown (1 of 3)]
[frame 4/26]
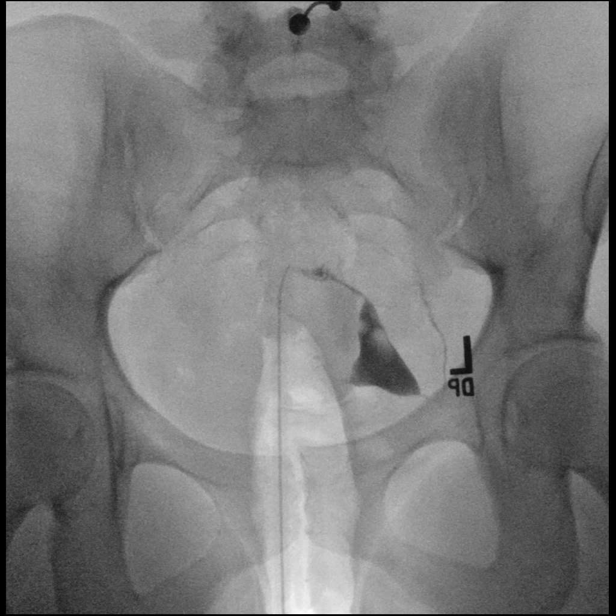
[frame 14/26]
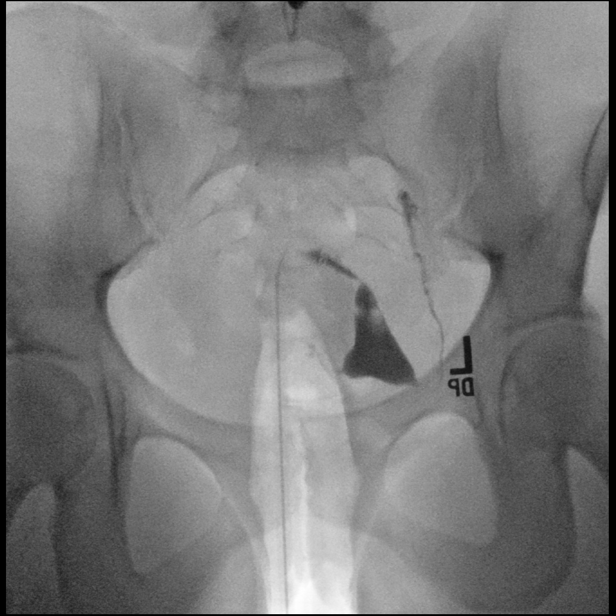
[frame 23/26]
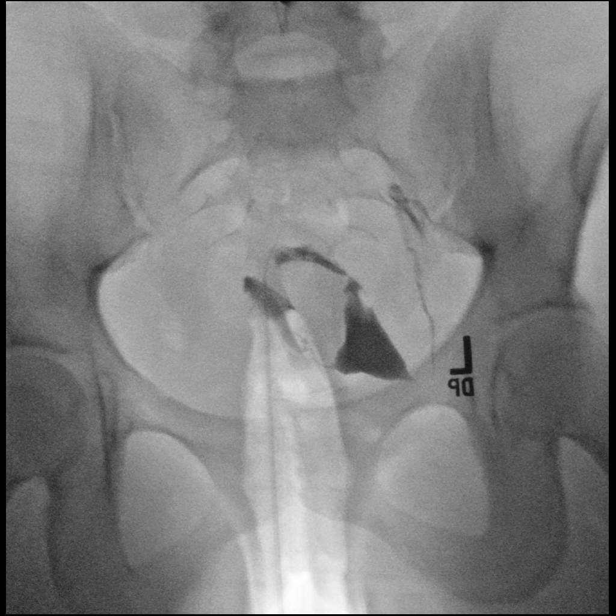

[Series 3: sequence · 4 of 26 frames shown (2 of 3)]
[frame 4/26]
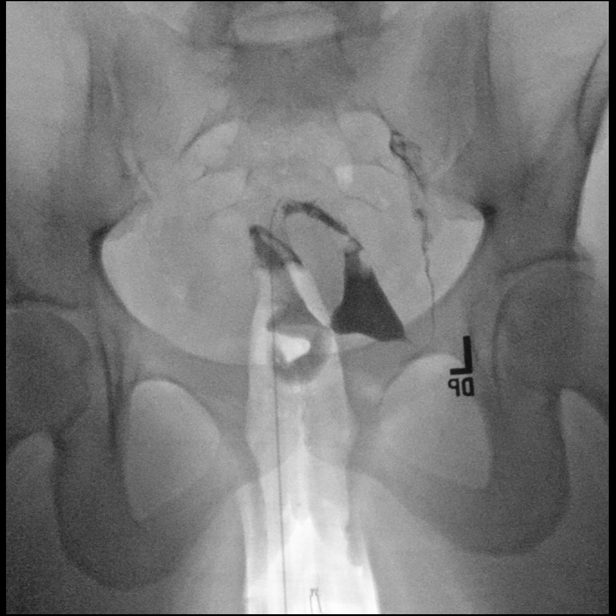
[frame 9/26]
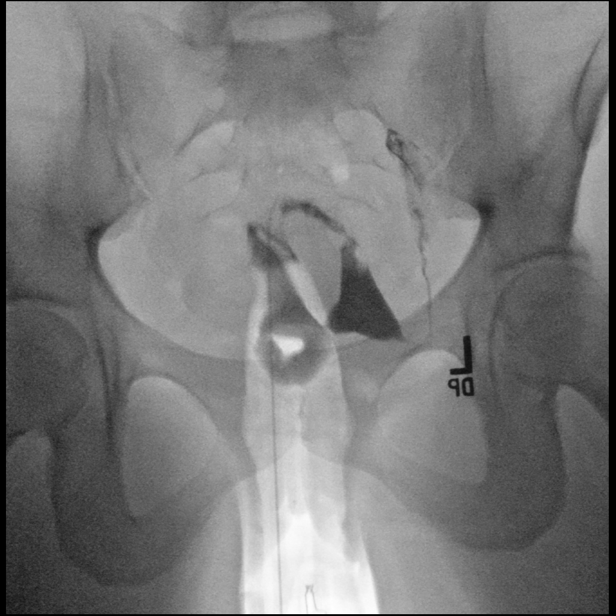
[frame 14/26]
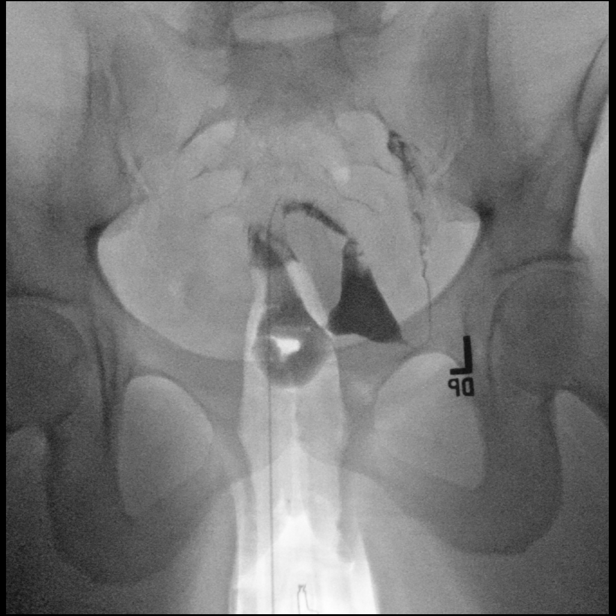
[frame 23/26]
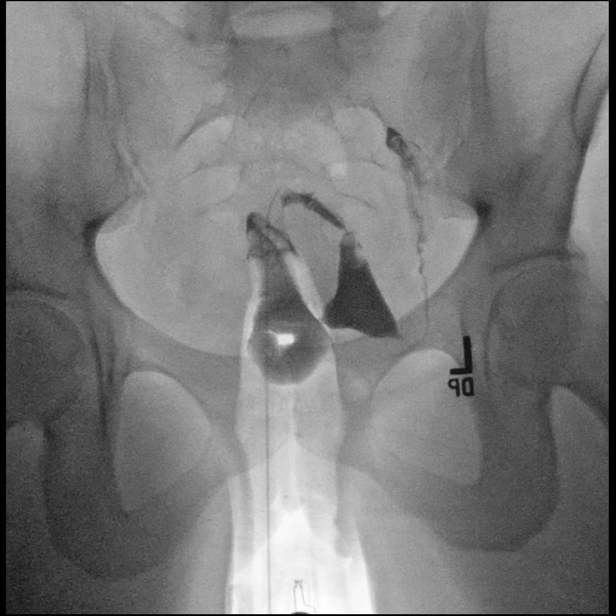

[Series 4: sequence · 3 of 7 frames shown (3 of 3)]
[frame 1/7]
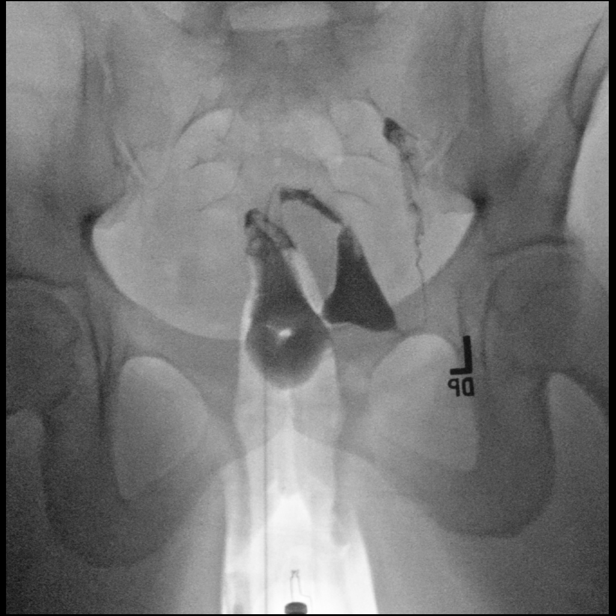
[frame 4/7]
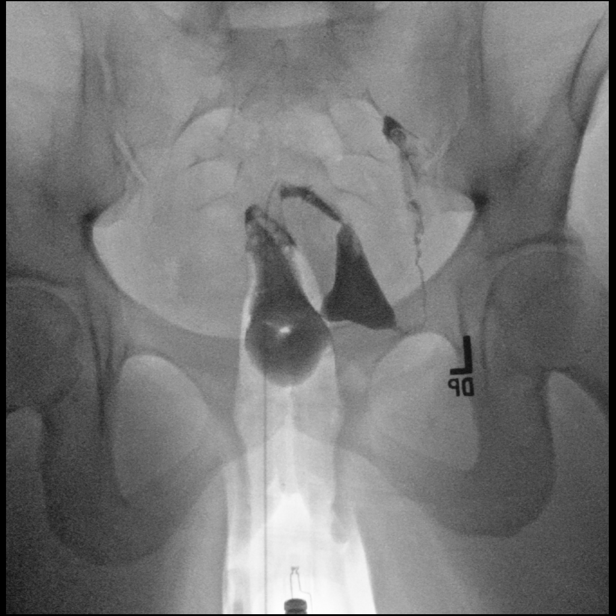
[frame 6/7]
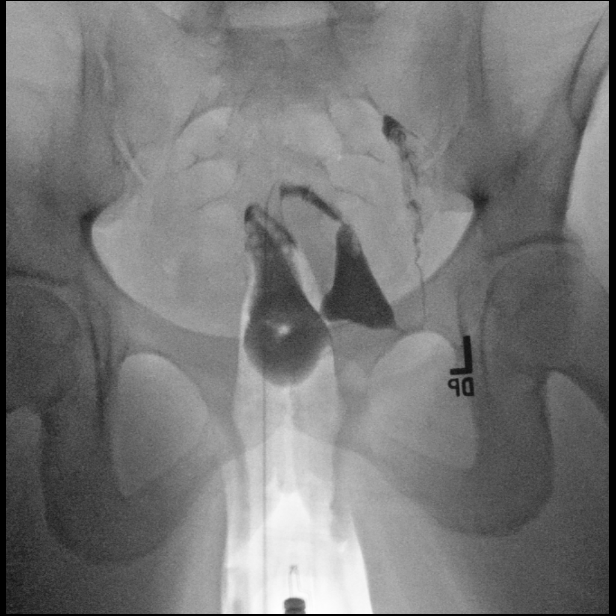

[Series 5: one shot · 0.15mm/px · 2 of 2 slices shown (2 of 2)]
[im 1/2]
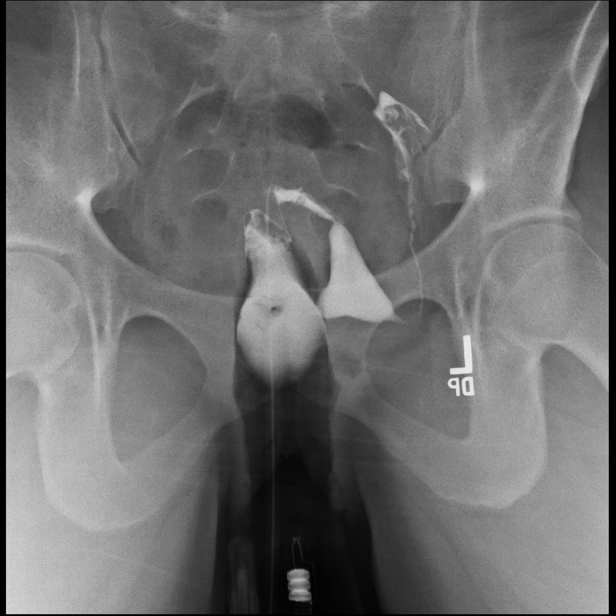
[im 2/2]
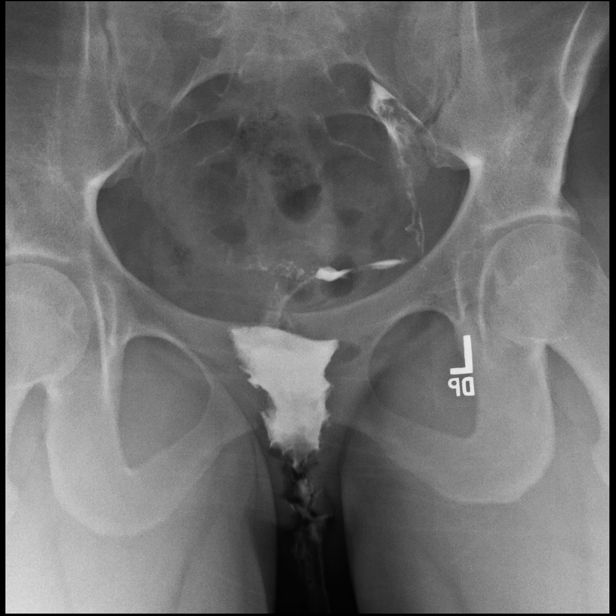

[14 of 16 positions shown; findings below may reference images not displayed]

FLUOROSCOPY TIME:  Radiation Exposure Index (as provided by the
fluoroscopic device): 89 mGy

If the device does not provide the exposure index:

Fluoroscopy Time:  54 seconds

Number of Acquired Images:  1
FINDINGS: The uterus appears retroflexed. The endometrial cavity is normal in
appearance and contour. No signs of mullerian duct anomaly.

Opacification of the left fallopian tube is visualized with
intraperitoneal spill of contrast.

The proximal portion of the right fallopian tube opacifies. No
spillage of the radiotracer the from the right tube however.
IMPRESSION: 1. Normal appearance of the uterus and left fallopian tube. Free
intraperitoneal spill of contrast from the left fallopian tube is
identified consistent with patency.
2. Only the proximal portion of the right tube visualized without
evidence for free intraperitoneal spill. Cannot confirm patency of
the right fallopian tube

## 2022-04-22 ENCOUNTER — Telehealth (HOSPITAL_COMMUNITY): Payer: Self-pay

## 2022-04-22 ENCOUNTER — Encounter (HOSPITAL_COMMUNITY): Payer: Self-pay | Admitting: Emergency Medicine

## 2022-04-22 ENCOUNTER — Ambulatory Visit (HOSPITAL_COMMUNITY)
Admission: EM | Admit: 2022-04-22 | Discharge: 2022-04-22 | Disposition: A | Discharge status: Home / Self Care | Payer: BC Managed Care – PPO | Attending: Urgent Care | Admitting: Urgent Care

## 2022-04-22 DIAGNOSIS — J069 Acute upper respiratory infection, unspecified: Secondary | ICD-10-CM | POA: Diagnosis not present

## 2022-04-22 DIAGNOSIS — H6590 Unspecified nonsuppurative otitis media, unspecified ear: Secondary | ICD-10-CM

## 2022-04-22 DIAGNOSIS — R0982 Postnasal drip: Secondary | ICD-10-CM

## 2022-04-22 MED ORDER — AZELASTINE HCL 137 MCG/SPRAY NA SOLN: 1.0000 | Freq: Two times a day (BID) | NASAL | 0 refills | Status: DC

## 2022-04-22 MED ORDER — LEVOCETIRIZINE DIHYDROCHLORIDE 5 MG PO TABS: 5.0000 mg | ORAL_TABLET | Freq: Every evening | ORAL | 0 refills | Status: DC

## 2022-04-22 MED ORDER — AZELASTINE HCL 137 MCG/SPRAY NA SOLN
1.0000 | Freq: Two times a day (BID) | NASAL | 0 refills | Status: DC
Start: 2022-04-22 — End: 2022-08-09

## 2022-04-22 NOTE — ED Triage Notes (Signed)
Pt reports a productive cough, bilateral ear pressure and feels like throat is dry x 1.5 weeks. States have tried tylenol cold and flu cough syrup with some relief.

## 2022-04-22 NOTE — ED Provider Notes (Signed)
[facility]    CSN: [id number] Arrival date & time: 04/22/22  0901      History   Chief Complaint Chief Complaint  Patient presents with   Cough   Ear Fullness    HPI Phyllis Burnett is a 26 y.o. female.   Pleasant 26 year old female with no known past medical history presents today with 1.5-week history of a scratchy itchy throat, itchy ears bilaterally, ear fullness, and significant nasal congestion.  She endorses postnasal drainage.  She states it is causing an intermittent cough productive of phlegm.  She denies fever or shortness of breath.  She denies sinus pain or headache.  She has been taking over-the-counter cold and flu products without symptom resolution.  She states in the 1.5 weeks, there was 1 to 2 days in which she felt slightly better, but feels that he is impacting her.  States her boyfriend has similar symptoms.  Patient is a [employer], denies any known sick contacts.   Cough Ear Fullness    History reviewed. No pertinent past medical history.  Patient Active Problem List   Diagnosis Date Noted   Family history of breast cancer 10/08/2017    History reviewed. No pertinent surgical history.  OB History   No obstetric history on file.      Home Medications    Prior to Admission medications   Medication Sig Start Date End Date Taking? Authorizing Provider  Azelastine HCl 137 MCG/SPRAY SOLN Place 1 spray into the nose in the morning and at bedtime. 04/22/22   Abrina Petz L, PA  levocetirizine (XYZAL) 5 MG tablet Take 1 tablet (5 mg total) by mouth every evening. 04/22/22   Anyely Cunning L, PA  VITAMIN D PO Take by mouth.    [provider]    Family History History reviewed. No pertinent family history.  Social History Social History   Tobacco Use   Smoking status: Never   Smokeless tobacco: Never  Substance Use Topics   Alcohol use: Yes   Drug use: No     Allergies   Patient has no known allergies.   Review  of Systems Review of Systems  Respiratory:  Positive for cough.   As per HPI   Physical Exam Triage Vital Signs ED Triage Vitals  Enc Vitals Group     BP 04/22/22 1027 132/80     Pulse Rate 04/22/22 1027 65     Resp 04/22/22 1027 18     Temp 04/22/22 1027 98.8 F (37.1 C)     Temp Source 04/22/22 1027 Oral     SpO2 04/22/22 1027 99 %     Weight --      Height --      Head Circumference --      Peak Flow --      Pain Score 04/22/22 1026 0     Pain Loc --      Pain Edu? --      Excl. in [place]? --    No data found.  Updated Vital Signs BP 132/80 (BP Location: Right Arm)   Pulse 65   Temp 98.8 F (37.1 C) (Oral)   Resp 18   SpO2 99%   Visual Acuity Right Eye Distance:   Left Eye Distance:   Bilateral Distance:    Right Eye Near:   Left Eye Near:    Bilateral Near:     Physical Exam Vitals and nursing note reviewed.  Constitutional:      General:  She is not in acute distress.    Appearance: Normal appearance. She is normal weight. She is not ill-appearing, toxic-appearing or diaphoretic.  HENT:     Head: Normocephalic and atraumatic.     Salivary Glands: Right salivary gland is not diffusely enlarged or tender. Left salivary gland is not diffusely enlarged or tender.     Right Ear: A middle ear effusion is present. No hemotympanum. Tympanic membrane is retracted. Tympanic membrane is not injected, scarred, perforated or erythematous.     Left Ear: A middle ear effusion is present. No hemotympanum. Tympanic membrane is retracted. Tympanic membrane is not injected, scarred, perforated or erythematous.     Nose: Rhinorrhea present. No congestion. Rhinorrhea is clear.     Right Turbinates: Enlarged and swollen.     Left Turbinates: Enlarged and swollen.     Right Sinus: No maxillary sinus tenderness or frontal sinus tenderness.     Left Sinus: No maxillary sinus tenderness or frontal sinus tenderness.     Mouth/Throat:     Lips: Pink.     Mouth: Mucous membranes are  moist. No injury or oral lesions.     Palate: No mass and lesions.     Pharynx: Oropharynx is clear. Uvula midline. No pharyngeal swelling, oropharyngeal exudate, posterior oropharyngeal erythema or uvula swelling.     Tonsils: No tonsillar exudate.     Comments: Mild clear post nasal drainage Cardiovascular:     Rate and Rhythm: Normal rate and regular rhythm.     Pulses: Normal pulses.     Heart sounds: No murmur heard.    No friction rub.  Pulmonary:     Effort: Pulmonary effort is normal. No respiratory distress.     Breath sounds: Normal breath sounds. No stridor. No wheezing, rhonchi or rales.  Chest:     Chest wall: No tenderness.  Musculoskeletal:     Cervical back: Normal range of motion. No rigidity or tenderness.  Lymphadenopathy:     Cervical: No cervical adenopathy.  Skin:    General: Skin is warm and dry.     Capillary Refill: Capillary refill takes less than 2 seconds.     Coloration: Skin is not jaundiced.     Findings: No bruising, erythema or rash.  Neurological:     General: No focal deficit present.     Mental Status: She is alert and oriented to person, place, and time.     Sensory: No sensory deficit.     Motor: No weakness.  Psychiatric:        Mood and Affect: Mood normal.        Behavior: Behavior normal.      UC Treatments / Results  Labs (all labs ordered are listed, but only abnormal results are displayed) Labs Reviewed - No data to display  EKG   Radiology No results found.  Procedures Procedures (including critical care time)  Medications Ordered in UC Medications - No data to display  Initial Impression / Assessment and Plan / UC Course  I have reviewed the triage vital signs and the nursing notes.  Pertinent labs & imaging results that were available during my care of the patient were reviewed by me and considered in my medical decision making (see chart for details).     Viral URI - supportive measures. VSS Post nasal  drainage - likely cause of cough and ear congestion. Start nasal spray and xyzal at night Fluid middle ear - secondary to nasal congestion. Tx as above  Final Clinical Impressions(s) / UC Diagnoses   Final diagnoses:  Viral URI with cough  Post-nasal drainage  Fluid collection of middle ear     Discharge Instructions      Your symptoms appear to be due to nasal congestion. Please start taking the antihistamine at nighttime.  It may make you drowsy. Please start using the nasal spray several times daily. Follow-up with your PCP or return to clinic should symptoms persist greater than 1 week or if any new symptoms develop.     ED Prescriptions     Medication Sig Dispense Auth. Provider   levocetirizine (XYZAL) 5 MG tablet Take 1 tablet (5 mg total) by mouth every evening. 30 tablet Shantella Blubaugh L, PA   Azelastine HCl 137 MCG/SPRAY SOLN Place 1 spray into the nose in the morning and at bedtime. 30 mL Champ Keetch L, PA      PDMP not reviewed this encounter.   Maretta Bees, [place] 04/22/22 1104

## 2022-04-22 NOTE — Discharge Instructions (Addendum)
Your symptoms appear to be due to nasal congestion. Please start taking the antihistamine at nighttime.  It may make you drowsy. Please start using the nasal spray several times daily. Follow-up with your PCP or return to clinic should symptoms persist greater than 1 week or if any new symptoms develop.

## 2022-08-09 ENCOUNTER — Telehealth: Payer: BC Managed Care – PPO | Admitting: Emergency Medicine

## 2022-08-09 DIAGNOSIS — K047 Periapical abscess without sinus: Secondary | ICD-10-CM

## 2022-08-09 MED ORDER — PENICILLIN V POTASSIUM 500 MG PO TABS: 500.0000 mg | ORAL_TABLET | Freq: Three times a day (TID) | ORAL | 0 refills | Status: AC

## 2022-08-09 NOTE — Progress Notes (Signed)
Virtual Visit Consent   Phyllis Burnett, you are scheduled for a virtual visit with a [facility] provider today. Just as with appointments in the office, your consent must be obtained to participate. Your consent will be active for this visit and any virtual visit you may have with one of our providers in the next 365 days. If you have a MyChart account, a copy of this consent can be sent to you electronically.  As this is a virtual visit, video technology does not allow for your provider to perform a traditional examination. This may limit your provider's ability to fully assess your condition. If your provider identifies any concerns that need to be evaluated in person or the need to arrange testing (such as labs, EKG, etc.), we will make arrangements to do so. Although advances in technology are sophisticated, we cannot ensure that it will always work on either your end or our end. If the connection with a video visit is poor, the visit may have to be switched to a telephone visit. With either a video or telephone visit, we are not always able to ensure that we have a secure connection.  By engaging in this virtual visit, you consent to the provision of healthcare and authorize for your insurance to be billed (if applicable) for the services provided during this visit. Depending on your insurance coverage, you may receive a charge related to this service.  I need to obtain your verbal consent now. Are you willing to proceed with your visit today? Phyllis Burnett has provided verbal consent on 08/09/2022 for a virtual visit (video or telephone). Cathlyn Parsons, NP  Date: 08/09/2022 10:35 AM  Virtual Visit via Video Note   I, Cathlyn Parsons, connected with  Phyllis Burnett  ([id number], Apr 15, 1996) on 08/09/22 at 10:30 AM EDT by a video-enabled telemedicine application and verified that I am speaking with the correct person using two identifiers.  Location: Patient: Virtual Visit Location  Patient: Other: parked car in [place] Provider: Virtual Visit Location Provider: Home Office   I discussed the limitations of evaluation and management by telemedicine and the availability of in person appointments. The patient expressed understanding and agreed to proceed.    History of Present Illness: Phyllis Burnett is a 26 y.o. who identifies as a female who was assigned female at birth, and is being seen today for possible dental infection.  Patient reports she broke a tooth on the right side of her mouth a couple of months ago.  She has been dragging out seeking care for it because she does not currently have dental insurance.  About a week ago she began having trouble with this broken tooth area.  She is got swelling on her face around where the tooth is as well as soreness in the back of her neck back of her head.  She describes it as like a sore muscle or a pulled muscle feeling.  She denies any swelling in the gums around the broken tooth or any drainage that she notices.  She has spoken with a dentist office and has an appointment next week but they advised her to get antibiotics in case she has a dental infection   HPI: HPI  Problems:  Patient Active Problem List   Diagnosis Date Noted   Family history of breast cancer 10/08/2017    Allergies: No Known Allergies Medications:  Current Outpatient Medications:    penicillin v potassium (VEETID) 500 MG tablet, Take 1  tablet (500 mg total) by mouth 3 (three) times daily for 10 days., Disp: 30 tablet, Rfl: 0   VITAMIN D PO, Take by mouth., Disp: , Rfl:   Observations/Objective: Patient is well-developed, well-nourished in no acute distress.  Resting comfortably in her parked car in [place].  Head is normocephalic, atraumatic.  No labored breathing.  Speech is clear and coherent with logical content.  Patient is alert and oriented at baseline.    Assessment and Plan: 1. Dental infection  Prescribed penicillin.  Patient will  follow-up with dentist as currently scheduled.  Follow Up Instructions: I discussed the assessment and treatment plan with the patient. The patient was provided an opportunity to ask questions and all were answered. The patient agreed with the plan and demonstrated an understanding of the instructions.  A copy of instructions were sent to the patient via MyChart unless otherwise noted below.   The patient was advised to call back or seek an in-person evaluation if the symptoms worsen or if the condition fails to improve as anticipated.  Time:  I spent 10 minutes with the patient via telehealth technology discussing the above problems/concerns.    Carvel Getting, NP

## 2022-08-09 NOTE — Patient Instructions (Signed)
  Dimitri Ped, thank you for joining Carvel Getting, NP for today's virtual visit.  While this provider is not your primary care provider (PCP), if your PCP is located in our provider database this encounter information will be shared with them immediately following your visit.   [facility] account gives you access to today's visit and all your visits, tests, and labs performed at [facility] " click here if you don't have a [facility] account or go to [email or web address]  Consent: (Patient) Phyllis Burnett provided verbal consent for this virtual visit at the beginning of the encounter.  Current Medications:  Current Outpatient Medications:    penicillin v potassium (VEETID) 500 MG tablet, Take 1 tablet (500 mg total) by mouth 3 (three) times daily for 10 days., Disp: 30 tablet, Rfl: 0   VITAMIN D PO, Take by mouth., Disp: , Rfl:    Medications ordered in this encounter:  Meds ordered this encounter  Medications   penicillin v potassium (VEETID) 500 MG tablet    Sig: Take 1 tablet (500 mg total) by mouth 3 (three) times daily for 10 days.    Dispense:  30 tablet    Refill:  0     *If you need refills on other medications prior to your next appointment, please contact your pharmacy*  Follow-Up: Call back or seek an in-person evaluation if the symptoms worsen or if the condition fails to improve as anticipated.  [facility] [phone]  Other Instructions See your dentist as soon as possible. Finish all of the antibiotics, even if you are feeling better. Use ibuprofen or Tylenol as directed on the package for pain.   If you have been instructed to have an in-person evaluation today at a local Urgent Care facility, please use the link below. It will take you to a list of all of our available [facility], including address, phone number and hours of operation. Please do not delay care.  [facility]  If you or a family member do not have a primary care provider, use the link below to schedule a visit and establish care. When you choose a [facility] primary care physician or advanced practice provider, you gain a long-term partner in health. Find a Primary Care Provider  Learn more about [facility]'s in-office and virtual care options: [facility]

## 2022-11-15 ENCOUNTER — Telehealth: Payer: BC Managed Care – PPO | Admitting: Nurse Practitioner

## 2022-11-15 DIAGNOSIS — K047 Periapical abscess without sinus: Secondary | ICD-10-CM

## 2022-11-15 MED ORDER — AMOXICILLIN-POT CLAVULANATE 875-125 MG PO TABS: 1.0000 | ORAL_TABLET | Freq: Two times a day (BID) | ORAL | 0 refills | Status: AC

## 2022-11-15 NOTE — Progress Notes (Signed)
Virtual Visit Consent   Phyllis Burnett, you are scheduled for a virtual visit with a [facility] provider today. Just as with appointments in the office, your consent must be obtained to participate. Your consent will be active for this visit and any virtual visit you may have with one of our providers in the next 365 days. If you have a MyChart account, a copy of this consent can be sent to you electronically.  As this is a virtual visit, video technology does not allow for your provider to perform a traditional examination. This may limit your provider's ability to fully assess your condition. If your provider identifies any concerns that need to be evaluated in person or the need to arrange testing (such as labs, EKG, etc.), we will make arrangements to do so. Although advances in technology are sophisticated, we cannot ensure that it will always work on either your end or our end. If the connection with a video visit is poor, the visit may have to be switched to a telephone visit. With either a video or telephone visit, we are not always able to ensure that we have a secure connection.  By engaging in this virtual visit, you consent to the provision of healthcare and authorize for your insurance to be billed (if applicable) for the services provided during this visit. Depending on your insurance coverage, you may receive a charge related to this service.  I need to obtain your verbal consent now. Are you willing to proceed with your visit today? Phyllis Burnett has provided verbal consent on 11/15/2022 for a virtual visit (video or telephone). Apolonio Schneiders, FNP  Date: 11/15/2022 1:41 PM  Virtual Visit via Video Note   I, Apolonio Schneiders, connected with  Phyllis Burnett  ([id number], May 17, 1996) on 11/15/22 at  1:45 PM EST by a video-enabled telemedicine application and verified that I am speaking with the correct person using two identifiers.  Location: Patient: Virtual Visit Location Patient:  Home Provider: Virtual Visit Location Provider: Home Office   I discussed the limitations of evaluation and management by telemedicine and the availability of in person appointments. The patient expressed understanding and agreed to proceed.    History of Present Illness:  Phyllis Burnett is a 27 y.o. who identifies as a female who was assigned female at birth, and is being seen today for pain related to wisdom teeth.  She was seen in November and was treated with PCN at that time. She has spoken with the dentist and is getting scheduled for removal but they requested she is restarted on antibiotics prior   She recently got dental insurance so that she can schedule the procedure   She is having headaches, tooth sensitivity and ear pain as well Denies fever   Problems:  Patient Active Problem List   Diagnosis Date Noted   Family history of breast cancer 10/08/2017    Allergies: No Known Allergies Medications:  Current Outpatient Medications:    VITAMIN D PO, Take by mouth., Disp: , Rfl:   Observations/Objective: Patient is well-developed, well-nourished in no acute distress.  Resting comfortably  at home.  Head is normocephalic, atraumatic.  No labored breathing.  Speech is clear and coherent with logical content.  Patient is alert and oriented at baseline.    Assessment and Plan: 1. Dental abscess Ibuprofen or tylenol for pain relief as needed  Schedule with dentist for removal   Take antibiotic with food   - amoxicillin-clavulanate (AUGMENTIN) 875-125 MG  tablet; Take 1 tablet by mouth 2 (two) times daily for 10 days.  Dispense: 20 tablet; Refill: 0     Follow Up Instructions: I discussed the assessment and treatment plan with the patient. The patient was provided an opportunity to ask questions and all were answered. The patient agreed with the plan and demonstrated an understanding of the instructions.  A copy of instructions were sent to the patient via MyChart  unless otherwise noted below.    The patient was advised to call back or seek an in-person evaluation if the symptoms worsen or if the condition fails to improve as anticipated.  Time:  I spent 9 minutes with the patient via telehealth technology discussing the above problems/concerns.    Apolonio Schneiders, FNP

## 2023-04-19 ENCOUNTER — Ambulatory Visit (HOSPITAL_COMMUNITY)
Admission: EM | Admit: 2023-04-19 | Discharge: 2023-04-19 | Disposition: A | Discharge status: Home / Self Care | Payer: BC Managed Care – PPO | Attending: Family Medicine | Admitting: Family Medicine

## 2023-04-24 ENCOUNTER — Telehealth: Payer: BC Managed Care – PPO | Admitting: Physician Assistant

## 2023-04-24 DIAGNOSIS — K047 Periapical abscess without sinus: Secondary | ICD-10-CM

## 2023-04-24 MED ORDER — NAPROXEN 500 MG PO TABS: 500.0000 mg | ORAL_TABLET | Freq: Two times a day (BID) | ORAL | 0 refills | Status: AC

## 2023-04-24 MED ORDER — AMOXICILLIN-POT CLAVULANATE 875-125 MG PO TABS: 1.0000 | ORAL_TABLET | Freq: Two times a day (BID) | ORAL | 0 refills | Status: AC

## 2023-04-24 NOTE — Progress Notes (Signed)
Virtual Visit Consent   [name], you are scheduled for a virtual visit with a [facility] provider today. Just as with appointments in the office, your consent must be obtained to participate. Your consent will be active for this visit and any virtual visit you may have with one of our providers in the next 365 days. If you have a MyChart account, a copy of this consent can be sent to you electronically.  As this is a virtual visit, video technology does not allow for your provider to perform a traditional examination. This may limit your provider's ability to fully assess your condition. If your provider identifies any concerns that need to be evaluated in person or the need to arrange testing (such as labs, EKG, etc.), we will make arrangements to do so. Although advances in technology are sophisticated, we cannot ensure that it will always work on either your end or our end. If the connection with a video visit is poor, the visit may have to be switched to a telephone visit. With either a video or telephone visit, we are not always able to ensure that we have a secure connection.  By engaging in this virtual visit, you consent to the provision of healthcare and authorize for your insurance to be billed (if applicable) for the services provided during this visit. Depending on your insurance coverage, you may receive a charge related to this service.  I need to obtain your verbal consent now. Are you willing to proceed with your visit today? Phyllis Burnett has provided verbal consent on 04/24/2023 for a virtual visit (video or telephone). [name], [place]  Date: 04/24/2023 4:22 PM  Virtual Visit via Video Note   I, [name], connected with  Phyllis Burnett  ([id number], [birth year]) on 04/24/23 at  4:15 PM EDT by a video-enabled telemedicine application and verified that I am speaking with the correct person using two identifiers.  Location: Patient: Virtual Visit  Location Patient: Home Provider: Virtual Visit Location Provider: Home Office   I discussed the limitations of evaluation and management by telemedicine and the availability of in person appointments. The patient expressed understanding and agreed to proceed.    History of Present Illness: Phyllis Burnett is a 27 y.o. who identifies as a female who was assigned female at birth, and is being seen today for concern of a dental abscess. Patient endorses some ongoing pain in her R lower wisdom tooth. Has appointment end of next week for an assessment but pain has increased with swelling around gum and associated headache. Denies fever, chills.    HPI: HPI  Problems:  Patient Active Problem List   Diagnosis Date Noted   Family history of breast cancer 10/08/2017    Allergies: No Known Allergies Medications:  Current Outpatient Medications:    amoxicillin-clavulanate (AUGMENTIN) 875-125 MG tablet, Take 1 tablet by mouth 2 (two) times daily., Disp: 14 tablet, Rfl: 0   naproxen (NAPROSYN) 500 MG tablet, Take 1 tablet (500 mg total) by mouth 2 (two) times daily with a meal., Disp: 30 tablet, Rfl: 0   VITAMIN D PO, Take by mouth., Disp: , Rfl:   Observations/Objective: Patient is well-developed, well-nourished in no acute distress.  Resting comfortably at home.  Head is normocephalic, atraumatic.  No labored breathing.  Speech is clear and coherent with logical content.  Patient is alert and oriented at baseline.   Assessment and Plan: 1. Dental infection - naproxen (NAPROSYN) 500 MG tablet;  Take 1 tablet (500 mg total) by mouth 2 (two) times daily with a meal.  Dispense: 30 tablet; Refill: 0 - amoxicillin-clavulanate (AUGMENTIN) 875-125 MG tablet; Take 1 tablet by mouth 2 (two) times daily.  Dispense: 14 tablet; Refill: 0  Start Augmentin and Naprosyn per orders.  Tylenol for breakthrough pain. Discussed use of clove paste and Peroxyl mouth rinse.   Follow Up Instructions: I  discussed the assessment and treatment plan with the patient. The patient was provided an opportunity to ask questions and all were answered. The patient agreed with the plan and demonstrated an understanding of the instructions.  A copy of instructions were sent to the patient via MyChart unless otherwise noted below.   The patient was advised to call back or seek an in-person evaluation if the symptoms worsen or if the condition fails to improve as anticipated.  Time:  I spent 10 minutes with the patient via telehealth technology discussing the above problems/concerns.    [name], PA-C

## 2023-04-24 NOTE — Patient Instructions (Signed)
Gretta Began, thank you for joining [name], PA-C for today's virtual visit.  While this provider is not your primary care provider (PCP), if your PCP is located in our provider database this encounter information will be shared with them immediately following your visit.   A [facility] MyChart account gives you access to today's visit and all your visits, tests, and labs performed at [facility] " click here if you don't have a [facility] MyChart account or go to [email or web address]  Consent: (Patient) Phyllis Burnett provided verbal consent for this virtual visit at the beginning of the encounter.  Current Medications:  Current Outpatient Medications:    VITAMIN D PO, Take by mouth., Disp: , Rfl:    Medications ordered in this encounter:  No orders of the defined types were placed in this encounter.    *If you need refills on other medications prior to your next appointment, please contact your pharmacy*  Follow-Up: Call back or seek an in-person evaluation if the symptoms worsen or if the condition fails to improve as anticipated.  [facility] [phone]  Other Instructions   We are sorry that you are not feeling well.  Here is how we plan to help!  Based on what you have shared with me in the questionnaire, it sounds like you have  a dental infection. I have prescribed Augmentin to treat the infection and Naprosyn to help with pain and inflammation. You can use OTC Tylenol as discussed, as well as the clove paste we talked about.  A toothache or tooth pain is caused when the nerve in the root of a tooth or surrounding a tooth is irritated. Dental (tooth) infection, decay, injury, or loss of a tooth are the most common causes of dental pain. Pain may also occur after an extraction (tooth is pulled out). Pain sometimes originates from other areas and radiates to the jaw, thus appearing to be tooth pain.Bacteria growing inside  your mouth can contribute to gum disease and dental decay, both of which can cause pain. A toothache occurs from inflammation of the central portion of the tooth called pulp. The pulp contains nerve endings that are very sensitive to pain. Inflammation to the pulp or pulpitis may be caused by dental cavities, trauma, and infection.    HOME CARE:   For toothaches: Over-the-counter pain medications such as acetaminophen or ibuprofen may be used. Take these as directed on the package while you arrange for a dental appointment. Avoid very cold or hot foods, because they may make the pain worse. You may get relief from biting on a cotton ball soaked in oil of cloves. You can get oil of cloves at most drug stores.  For jaw pain:  Aspirin may be helpful for problems in the joint of the jaw in adults. If pain happens every time you open your mouth widely, the temporomandibular joint (TMJ) may be the source of the pain. Yawning or taking a large bite of food may worsen the pain. An appointment with your doctor or dentist will help you find the cause.     GET HELP RIGHT AWAY IF:  You have a high fever or chills If you have had a recent head or face injury and develop headache, light headedness, nausea, vomiting, or other symptoms that concern you after an injury to your face or mouth, you could have a more serious injury in addition to your dental injury. A facial rash associated with a  toothache: This condition may improve with medication. Contact your doctor for them to decide what is appropriate. Any jaw pain occurring with chest pain: Although jaw pain is most commonly caused by dental disease, it is sometimes referred pain from other areas. People with heart disease, especially people who have had stents placed, people with diabetes, or those who have had heart surgery may have jaw pain as a symptom of heart attack or angina. If your jaw or tooth pain is associated with lightheadedness, sweating, or  shortness of breath, you should see a doctor as soon as possible. Trouble swallowing or excessive pain or bleeding from gums: If you have a history of a weakened immune system, diabetes, or steroid use, you may be more susceptible to infections. Infections can often be more severe and extensive or caused by unusual organisms. Dental and gum infections in people with these conditions may require more aggressive treatment. An abscess may need draining or IV antibiotics, for example.  MAKE SURE YOU   Understand these instructions. Will watch your condition. Will get help right away if you are not doing well or get worse.      If you have been instructed to have an in-person evaluation today at a local Urgent Care facility, please use the link below. It will take you to a list of all of our available [facility], including address, phone number and hours of operation. Please do not delay care.  [facility]  If you or a family member do not have a primary care provider, use the link below to schedule a visit and establish care. When you choose a [facility] primary care physician or advanced practice provider, you gain a long-term partner in health. Find a Primary Care Provider  Learn more about [facility]'s in-office and virtual care options: [facility] - Get Care Now

## 2024-05-10 ENCOUNTER — Ambulatory Visit (HOSPITAL_COMMUNITY): Admission: EM | Admit: 2024-05-10 | Discharge: 2024-05-10 | Disposition: A | Discharge status: Home / Self Care

## 2024-05-10 ENCOUNTER — Encounter (HOSPITAL_COMMUNITY): Payer: Self-pay

## 2024-05-10 DIAGNOSIS — H00015 Hordeolum externum left lower eyelid: Secondary | ICD-10-CM

## 2024-05-10 NOTE — Discharge Instructions (Addendum)
 Apply frequent warm compresses to your stye (not ice) using a clean washcloth each time. This should take care of the stye with time. If it's getting worse or you are worried it is infected, return here or see the eye doctor

## 2024-05-10 NOTE — ED Provider Notes (Signed)
 [facility]    CSN: [id number] Arrival date & time: 05/10/24  0802      History   Chief Complaint No chief complaint on file.   HPI Phyllis Burnett is a 28 y.o. female. Pt had a stye on her lower left eyelid 2 weeks ago that lasted about 4 days, then resolved. It came back 2 days ago. Has been using ice, hot compresses, and otc stye eye drops and ointment. No change in vision or eye discharge.   HPI  History reviewed. No pertinent past medical history.  Patient Active Problem List   Diagnosis Date Noted   Family history of breast cancer 10/08/2017    History reviewed. No pertinent surgical history.  OB History   No obstetric history on file.      Home Medications    Prior to Admission medications   Medication Sig Start Date End Date Taking? Authorizing Provider  amoxicillin -clavulanate (AUGMENTIN ) 875-125 MG tablet Take 1 tablet by mouth 2 (two) times daily. 04/24/23   Gladis Elsie BROCKS, PA-C  naproxen  (NAPROSYN ) 500 MG tablet Take 1 tablet (500 mg total) by mouth 2 (two) times daily with a meal. 04/24/23   Gladis Elsie BROCKS, PA-C  VITAMIN D PO Take by mouth. Patient not taking: Reported on 05/10/2024    [provider]    Family History History reviewed. No pertinent family history.  Social History Social History   Tobacco Use   Smoking status: Never   Smokeless tobacco: Never  Vaping Use   Vaping status: Never Used  Substance Use Topics   Alcohol use: Yes   Drug use: No     Allergies   Patient has no known allergies.   Review of Systems Review of Systems   Physical Exam Triage Vital Signs ED Triage Vitals [05/10/24 0815]  Encounter Vitals Group     BP 122/67     Girls Systolic BP Percentile      Girls Diastolic BP Percentile      Boys Systolic BP Percentile      Boys Diastolic BP Percentile      Pulse Rate 66     Resp 14     Temp 98.2 F (36.8 C)     Temp Source Oral     SpO2 99 %     Weight      Height      Head  Circumference      Peak Flow      Pain Score      Pain Loc      Pain Education      Exclude from Growth Chart    No data found.  Updated Vital Signs BP 122/67 (BP Location: Right Arm)   Pulse 66   Temp 98.2 F (36.8 C) (Oral)   Resp 14   LMP 04/20/2024 (Approximate)   SpO2 99%   Visual Acuity Right Eye Distance:   Left Eye Distance:   Bilateral Distance:    Right Eye Near:   Left Eye Near:    Bilateral Near:     Physical Exam Constitutional:      Appearance: Normal appearance.  Eyes:     General:        Right eye: No hordeolum.        Left eye: Hordeolum present.No discharge.     Conjunctiva/sclera:     Left eye: Left conjunctiva is not injected.  Pulmonary:     Effort: Pulmonary effort is normal.  Neurological:  Mental Status: She is alert.      UC Treatments / Results  Labs (all labs ordered are listed, but only abnormal results are displayed) Labs Reviewed - No data to display  EKG   Radiology No results found.  Procedures Procedures (including critical care time)  Medications Ordered in UC Medications - No data to display  Initial Impression / Assessment and Plan / UC Course  I have reviewed the triage vital signs and the nursing notes.  Pertinent labs & imaging results that were available during my care of the patient were reviewed by me and considered in my medical decision making (see chart for details).    I do not see sign of infection. Stye is small. Discussed supportive care and reasons for seeking f/u.   Final Clinical Impressions(s) / UC Diagnoses   Final diagnoses:  Hordeolum externum of left lower eyelid     Discharge Instructions      Apply frequent warm compresses to your stye (not ice) using a clean washcloth each time. This should take care of the stye with time. If it's getting worse or you are worried it is infected, return here or see the eye doctor   ED Prescriptions   None    PDMP not reviewed this  encounter.   Richad Jon HERO, NP 05/10/24 [phone]

## 2024-05-10 NOTE — ED Triage Notes (Signed)
 Patient c/o of a stye on her left eye x 2 weeks. Patient c/o itchiness at times.  Patient states she has been using OTC stye eye drops.
